# Patient Record
Sex: Female | Born: 1950 | Race: White | Hispanic: No | Marital: Married
Health system: Southern US, Community
[De-identification: ages and names within clinical notes are randomized; demographics above are authoritative.]

## PROBLEM LIST (undated history)

## (undated) DIAGNOSIS — I10 Essential (primary) hypertension: Secondary | ICD-10-CM

## (undated) HISTORY — DX: Essential (primary) hypertension: I10

## (undated) HISTORY — PX: RECTO-VAGINAL FISSURE REPAIR: SHX5277

---

## 2010-08-10 ENCOUNTER — Emergency Department (HOSPITAL_COMMUNITY): Admission: EM | Admit: 2010-08-10 | Discharge: 2010-08-10 | Payer: Self-pay | Admitting: Emergency Medicine

## 2010-08-13 ENCOUNTER — Emergency Department (HOSPITAL_COMMUNITY): Admission: EM | Admit: 2010-08-13 | Discharge: 2010-08-13 | Payer: Self-pay | Admitting: Emergency Medicine

## 2014-02-18 ENCOUNTER — Encounter (INDEPENDENT_AMBULATORY_CARE_PROVIDER_SITE_OTHER): Payer: Self-pay

## 2014-02-18 ENCOUNTER — Ambulatory Visit: Payer: Self-pay | Admitting: Family Medicine

## 2014-02-18 ENCOUNTER — Telehealth: Payer: Self-pay | Admitting: Family Medicine

## 2014-02-18 ENCOUNTER — Ambulatory Visit (INDEPENDENT_AMBULATORY_CARE_PROVIDER_SITE_OTHER): Payer: Self-pay

## 2014-02-18 VITALS — BP 140/80 | HR 69 | Temp 98.3°F

## 2014-02-18 DIAGNOSIS — M25572 Pain in left ankle and joints of left foot: Secondary | ICD-10-CM

## 2014-02-18 DIAGNOSIS — S82839A Other fracture of upper and lower end of unspecified fibula, initial encounter for closed fracture: Secondary | ICD-10-CM

## 2014-02-18 DIAGNOSIS — M25579 Pain in unspecified ankle and joints of unspecified foot: Secondary | ICD-10-CM

## 2014-02-18 DIAGNOSIS — S82899A Other fracture of unspecified lower leg, initial encounter for closed fracture: Secondary | ICD-10-CM

## 2014-02-18 MED ORDER — HYDROCODONE-ACETAMINOPHEN 5-325 MG PO TABS: 1.0000 | ORAL_TABLET | Freq: Four times a day (QID) | ORAL | Status: DC | PRN

## 2014-02-18 NOTE — Telephone Encounter (Signed)
appt given for 3:30 with Christine Gutierrez

## 2014-02-18 NOTE — Progress Notes (Signed)
   Subjective:    Patient ID: Christine Gutierrez, female    DOB: 12-Feb-1951, 63 y.o.   MRN: [id number]  HPI This 63 y.o. female presents for evaluation of pain and injury to her left ankle. She has severe pain in her left ankle.   Review of Systems C/o severe pain in her left ankle   No chest pain, SOB, HA, dizziness, vision change, N/V, diarrhea, constipation, dysuria, urinary urgency or frequency or rash.  Objective:   Physical Exam  Vital signs noted  Well developed well nourished female.  HEENT - Head atraumatic Normocephalic Respiratory - Lungs CTA bilateral Cardiac - RRR S1 and S2 without murmur MS - Left lateral ankle swollen and tender.     Xray of left ankle - Distal displaced fibula fx Prelimnary reading by Chrissie NoaWilliam Hamilton Marinello,FNP    Posterior splint put on left lower extremity Assessment & Plan:  Ankle pain, left - Plan: DG Ankle Complete Left, HYDROcodone-acetaminophen (NORCO) 5-325 MG per tablet, Ambulatory referral to Orthopedic Surgery  Fracture of distal fibula - Plan: HYDROcodone-acetaminophen (NORCO) 5-325 MG per tablet, Ambulatory referral to Orthopedic Surgery  Deatra CanterWilliam J Erlin Gardella FNP

## 2014-11-03 ENCOUNTER — Other Ambulatory Visit (HOSPITAL_COMMUNITY): Payer: Self-pay | Admitting: Internal Medicine

## 2014-11-03 DIAGNOSIS — Z1231 Encounter for screening mammogram for malignant neoplasm of breast: Secondary | ICD-10-CM

## 2014-11-13 ENCOUNTER — Ambulatory Visit (HOSPITAL_COMMUNITY): Payer: Self-pay

## 2014-12-11 ENCOUNTER — Ambulatory Visit (HOSPITAL_COMMUNITY): Payer: Self-pay

## 2017-01-01 ENCOUNTER — Other Ambulatory Visit: Payer: Self-pay | Admitting: Neurology

## 2017-01-01 DIAGNOSIS — R51 Headache: Principal | ICD-10-CM

## 2017-01-01 DIAGNOSIS — R519 Headache, unspecified: Secondary | ICD-10-CM

## 2017-01-05 ENCOUNTER — Other Ambulatory Visit: Payer: Self-pay | Admitting: Neurology

## 2017-01-05 DIAGNOSIS — R51 Headache: Principal | ICD-10-CM

## 2017-01-05 DIAGNOSIS — R519 Headache, unspecified: Secondary | ICD-10-CM

## 2017-01-22 ENCOUNTER — Other Ambulatory Visit: Payer: Self-pay | Admitting: Neurology

## 2017-01-22 DIAGNOSIS — M542 Cervicalgia: Secondary | ICD-10-CM

## 2017-01-26 ENCOUNTER — Inpatient Hospital Stay
Admission: RE | Admit: 2017-01-26 | Discharge: 2017-01-26 | Disposition: A | Discharge status: Home / Self Care | Payer: Self-pay | Source: Ambulatory Visit | Attending: Neurology | Admitting: Neurology

## 2017-01-27 ENCOUNTER — Ambulatory Visit
Admission: RE | Admit: 2017-01-27 | Discharge: 2017-01-27 | Disposition: A | Discharge status: Home / Self Care | Payer: Self-pay | Source: Ambulatory Visit | Attending: Neurology | Admitting: Neurology

## 2017-01-27 DIAGNOSIS — R519 Headache, unspecified: Secondary | ICD-10-CM

## 2017-01-27 DIAGNOSIS — R51 Headache: Principal | ICD-10-CM

## 2017-01-28 ENCOUNTER — Other Ambulatory Visit: Payer: Self-pay

## 2017-01-30 ENCOUNTER — Ambulatory Visit
Admission: RE | Admit: 2017-01-30 | Discharge: 2017-01-30 | Disposition: A | Discharge status: Home / Self Care | Payer: Worker's Compensation | Source: Ambulatory Visit | Attending: Neurology | Admitting: Neurology

## 2017-01-30 DIAGNOSIS — M542 Cervicalgia: Secondary | ICD-10-CM

## 2017-02-01 ENCOUNTER — Other Ambulatory Visit: Payer: Self-pay

## 2017-02-11 ENCOUNTER — Other Ambulatory Visit: Payer: Self-pay

## 2017-07-09 ENCOUNTER — Encounter (INDEPENDENT_AMBULATORY_CARE_PROVIDER_SITE_OTHER): Payer: Self-pay | Admitting: Orthopaedic Surgery

## 2017-07-09 ENCOUNTER — Ambulatory Visit (INDEPENDENT_AMBULATORY_CARE_PROVIDER_SITE_OTHER): Payer: Worker's Compensation | Admitting: Orthopaedic Surgery

## 2017-07-09 VITALS — BP 166/85 | HR 62 | Resp 14 | Ht 66.0 in | Wt 175.0 lb

## 2017-07-09 DIAGNOSIS — M25511 Pain in right shoulder: Secondary | ICD-10-CM | POA: Diagnosis not present

## 2017-07-09 DIAGNOSIS — G8929 Other chronic pain: Secondary | ICD-10-CM | POA: Diagnosis not present

## 2017-07-09 NOTE — Progress Notes (Signed)
Office Visit Note   Patient: Christine Gutierrez           Date of Birth: 24-May-1951           MRN: [id number] Visit Date: 07/09/2017              Requested by: No referring provider defined for this encounter. PCP: No primary care provider on file.   Assessment & Plan: Visit Diagnoses: Right shoulder pain with impingement and mild adhesive capsulitis  Plan: MRI scan right shoulder and follow-up thereafter  Follow-Up Instructions: No Follow-up on file.   Orders:  No orders of the defined types were placed in this encounter.  No orders of the defined types were placed in this encounter.     Procedures: No procedures performed   Clinical Data: No additional findings.   Subjective: Chief Complaint  Patient presents with  . Right Shoulder - Injury    Workman's Comp::: Christine Gutierrez is a 66 y o that presents with chronic R shoulder pain from a work injury from May 2017. She fell forward from a throw rug fall. She saw a neurologist then and not much else.   Christine Gutierrez is accompanied by rehabilitation nurse from [facility] and here for evaluation of an injury she sustained to her right shoulder. She lives and [place] [facility] and was working for a company in [employer] activities. She was on a brake when she tripped over a rug falling on her face. At the time she had a lot of bruising and by the next day was so uncomfortable she was seen at a local urgent care. She has been seen on a number of occasions at that particular urgent care for follow-up evaluation of problems with her face as well as for right shoulder pain that started within 24-48 hours after the injury. Her predominant problem was the issue with her face. She was eventually seen by Dr. Vela Prose, a neurologist in [place]. He has been working with her and is in the midst of an evaluation. She's tried "several medicines". Her secondary problems with her right shoulder.  She's having difficulty raising her arm over her head, reaching backwards and even sleeping on that side. Prior to the injury she did not have any problems. She doesn't really have any neck problems. She's not had any numbness or tingling. She denies shortness breath or chest pain. No skin changes or bruising. She was able to work for a number of months after the injury but was recently "fired". She hasn't worked since. Notes from her urgent care evaluation accompany her and scanned to the chart. Lorinda Creed, from [place], is her medical case manager and who  was present throughout the evaluation and discussion HPI  Review of Systems  Constitutional: Negative for chills, fatigue and fever.  Eyes: Negative for itching.  Respiratory: Negative for chest tightness and shortness of breath.   Cardiovascular: Negative for chest pain, palpitations and leg swelling.  Gastrointestinal: Negative for blood in stool, constipation and diarrhea.  Musculoskeletal: Negative for back pain, joint swelling, neck pain and neck stiffness.  Neurological: Positive for dizziness and headaches. Negative for numbness.  Hematological: Does not bruise/bleed easily.  Psychiatric/Behavioral: The patient is not nervous/anxious.      Objective: Vital Signs: There were no vitals taken for this visit.  Physical Exam  Ortho Exam awake alert and oriented 3 comfortable the sitting position. She did lack some overhead motion consistent with early adhesive capsulitis.  She is unable to place her right hand in the middle of her back. Positive impingement and positive empty can testing. Little bit of weakness with external rotation none with internal rotation. No pain over the acromioclavicular joint. No pain in the subacromial region. Skin intact. Biceps intact. Neurovascular exam intact to her right hand. Good grip and release. No referred pain to the right shoulder with range of motion of the cervical spine Walk without a limp. No  shortness of breath or chest pain  Specialty Comments:  No specialty comments available.  Imaging: No results found.   PMFS History: There are no active problems to display for this patient.  No past medical history on file.  No family history on file.  No past surgical history on file. Social History   Occupational History  . Not on file.   Social History Main Topics  . Smoking status: Not on file  . Smokeless tobacco: Not on file  . Alcohol use Not on file  . Drug use: Unknown  . Sexual activity: Not on file

## 2017-07-23 ENCOUNTER — Other Ambulatory Visit (INDEPENDENT_AMBULATORY_CARE_PROVIDER_SITE_OTHER): Payer: Self-pay

## 2017-07-23 ENCOUNTER — Telehealth (INDEPENDENT_AMBULATORY_CARE_PROVIDER_SITE_OTHER): Payer: Self-pay | Admitting: Orthopaedic Surgery

## 2017-07-23 DIAGNOSIS — M25511 Pain in right shoulder: Principal | ICD-10-CM

## 2017-07-23 DIAGNOSIS — G8929 Other chronic pain: Secondary | ICD-10-CM

## 2017-07-23 NOTE — Telephone Encounter (Signed)
I enter the MRI Right shoulder WO contrast(external with no site selected). Please see the message taken at front desk.

## 2017-07-23 NOTE — Telephone Encounter (Signed)
Worker's Comp company called requesting script for MRI be faxed over to them.  Fax #[phone] attnJanae Sauce Cb#[phone]

## 2017-07-24 NOTE — Telephone Encounter (Signed)
faxed

## 2017-07-31 ENCOUNTER — Other Ambulatory Visit: Payer: Self-pay

## 2017-10-18 ENCOUNTER — Other Ambulatory Visit: Payer: Self-pay | Admitting: Neurosurgery

## 2017-10-18 DIAGNOSIS — R519 Headache, unspecified: Secondary | ICD-10-CM

## 2017-10-18 DIAGNOSIS — R51 Headache: Principal | ICD-10-CM

## 2017-11-07 ENCOUNTER — Ambulatory Visit
Admission: RE | Admit: 2017-11-07 | Discharge: 2017-11-07 | Disposition: A | Discharge status: Home / Self Care | Payer: Worker's Compensation | Source: Ambulatory Visit | Attending: Neurosurgery | Admitting: Neurosurgery

## 2017-11-07 ENCOUNTER — Ambulatory Visit
Admission: RE | Admit: 2017-11-07 | Discharge: 2017-11-07 | Disposition: A | Discharge status: Home / Self Care | Payer: Worker's Compensation | Source: Ambulatory Visit | Attending: Orthopaedic Surgery | Admitting: Orthopaedic Surgery

## 2017-11-07 DIAGNOSIS — G8929 Other chronic pain: Secondary | ICD-10-CM

## 2017-11-07 DIAGNOSIS — R51 Headache: Principal | ICD-10-CM

## 2017-11-07 DIAGNOSIS — M25511 Pain in right shoulder: Principal | ICD-10-CM

## 2017-11-07 DIAGNOSIS — R519 Headache, unspecified: Secondary | ICD-10-CM

## 2017-11-16 ENCOUNTER — Ambulatory Visit (INDEPENDENT_AMBULATORY_CARE_PROVIDER_SITE_OTHER): Payer: Self-pay | Admitting: Orthopaedic Surgery

## 2017-11-23 ENCOUNTER — Telehealth: Payer: Self-pay

## 2017-11-23 ENCOUNTER — Ambulatory Visit (INDEPENDENT_AMBULATORY_CARE_PROVIDER_SITE_OTHER): Payer: Worker's Compensation | Admitting: Orthopaedic Surgery

## 2017-11-23 ENCOUNTER — Encounter (INDEPENDENT_AMBULATORY_CARE_PROVIDER_SITE_OTHER): Payer: Self-pay | Admitting: Orthopaedic Surgery

## 2017-11-23 VITALS — BP 154/86 | HR 80 | Resp 14 | Ht 64.0 in | Wt 185.0 lb

## 2017-11-23 DIAGNOSIS — M25511 Pain in right shoulder: Secondary | ICD-10-CM

## 2017-11-23 DIAGNOSIS — G8929 Other chronic pain: Secondary | ICD-10-CM

## 2017-11-23 NOTE — Progress Notes (Signed)
Office Visit Note   Patient: Christine Gutierrez           Date of Birth: [birth year]           MRN: [id number] Visit Date: 11/23/2017              Requested by: No referring provider defined for this encounter. PCP: Christine PontMahoney, Mark, DO   Assessment & Plan: Visit Diagnoses:  1. Chronic right shoulder pain     Plan: Christine Gutierrez was last seen in August for evaluation of right shoulder pain as outlined in her prior office note. This was a workman's comp injury 2017. I ordered an MRI scan. This scan was just performed December 5. MRI scan reveals supraspinatus worse than infraspinatus tendinopathy. No tear identified. No muscles were normal without atrophy. She had tendinopathy of the intra-articular segment of the biceps long head. There were moderate degenerative changes at the acromioclavicular joint with a type I acromion. Glenohumeral joint was negative. Labrum was intact. I think a course of physical therapy would be helpful. This is a workman's comp injury she will need to check with a carrier and the case Production designer, theatre/television/filmmanager. I'd like to see her back in 4-6 weeks after she's had a course of therapy consider further treatment options.  Follow-Up Instructions: Return in about 1 month (around 12/24/2017).   Orders:  No orders of the defined types were placed in this encounter.  No orders of the defined types were placed in this encounter.     Procedures: No procedures performed   Clinical Data: No additional findings.   Subjective: Chief Complaint  Patient presents with  . Right Shoulder - Pain    MRI results of right shoulder.  [place] for problems referable to her head and neck also related to a workman's comp injury. Apparently she's not had any specific treatment in regards to her shoulder since I saw her in August. Just had an MRI scan on December 5. I reviewed this with her as above  HPI  Review of Systems  Constitutional:  Positive for fatigue. Negative for chills and fever.  Eyes: Negative for itching.  Respiratory: Negative for chest tightness and shortness of breath.   Cardiovascular: Negative for chest pain, palpitations and leg swelling.  Gastrointestinal: Negative for blood in stool, constipation and diarrhea.  Endocrine: Negative for polyuria.  Genitourinary: Negative for dysuria.  Musculoskeletal: Positive for neck pain and neck stiffness. Negative for back pain and joint swelling.  Allergic/Immunologic: Negative for immunocompromised state.  Neurological: Positive for light-headedness and headaches. Negative for dizziness and numbness.  Hematological: Does not bruise/bleed easily.  Psychiatric/Behavioral: Positive for sleep disturbance. The patient is not nervous/anxious.      Objective: Vital Signs: BP (!) 154/86   Pulse 80   Resp 14   Ht 5\' 4"  (1.626 m)   Wt 185 lb (83.9 kg)   BMI 31.76 kg/m   Physical Exam  Ortho Exam right shoulder with a painful overhead arc of motion. She did lack about 30-40 of full overhead motion consistent with adhesive capsulitis. No pain with her arm at his side. No tenderness at the acromioclavicular joint. Biceps intact. Skin intact. Good strength. Some pain with abduction and 90 the subacromial region which also could be associated with adhesive capsulitis. Specialty Comments:  No specialty comments available.  Imaging: No results found.   PMFS History: There are no active problems to display for this patient.  History reviewed. No  pertinent past medical history.  History reviewed. No pertinent family history.  History reviewed. No pertinent surgical history. Social History   Occupational History  . Not on file  Tobacco Use  . Smoking status: Former Games developermoker  . Smokeless tobacco: Former Engineer, waterUser  Substance and Sexual Activity  . Alcohol use: No  . Drug use: No  . Sexual activity: Not on file

## 2017-11-23 NOTE — Telephone Encounter (Signed)
FYI- Patient called to give Dr. Cleophas DunkerWhitfield information:  Irene LimboAdjustor-Traci Bailey [phone] Fax#[phone]#-[id number] [email or web address]

## 2017-12-06 ENCOUNTER — Telehealth (INDEPENDENT_AMBULATORY_CARE_PROVIDER_SITE_OTHER): Payer: Self-pay | Admitting: Orthopaedic Surgery

## 2017-12-06 NOTE — Telephone Encounter (Signed)
Do you know anything regarding this patient

## 2017-12-06 NOTE — Telephone Encounter (Signed)
Patient left a message stating that at her last visit Dr. Cleophas DunkerWhitfield told her she was going to be referred to therapy.  Because it is WC her case worker Lourena SimmondsRebecca Berbahl will need to be called to get approval.  Her [phone]

## 2017-12-07 ENCOUNTER — Other Ambulatory Visit (INDEPENDENT_AMBULATORY_CARE_PROVIDER_SITE_OTHER): Payer: Self-pay

## 2017-12-07 DIAGNOSIS — M25511 Pain in right shoulder: Principal | ICD-10-CM

## 2017-12-07 DIAGNOSIS — G8929 Other chronic pain: Secondary | ICD-10-CM

## 2017-12-07 NOTE — Telephone Encounter (Signed)
Faxed and emailed office note and order to Irene Limbodjustor-Traci Bailey [phone] Fax#[phone] Claim#-[id number] [email or web address]

## 2017-12-07 NOTE — Telephone Encounter (Signed)
Entered order. Thank you Amy

## 2017-12-07 NOTE — Telephone Encounter (Signed)
Faxed and emailed office note and order to Adjustor-Traci Bailey [phone] Fax#-[phone] Claim#-[id number] [email or web address]   

## 2017-12-25 ENCOUNTER — Telehealth (INDEPENDENT_AMBULATORY_CARE_PROVIDER_SITE_OTHER): Payer: Self-pay | Admitting: Orthopaedic Surgery

## 2017-12-25 NOTE — Telephone Encounter (Signed)
Please see message below. I called pt, she was confused as to an injection. I saw in Dr. Hoy RegisterWhitfield's last note from 11/23/17 that he wanted her to go to PT.  Patient said she did go to PT for neck pain, and "they knew how bad my shoulder hurt" and tried to do exercises but the pain was too great. Apparently she was waiting on a Dr. Lorelle Gibbsurling (?) or Dr. Cleophas DunkerWhitfield as to next step. I read her your last entry and she said the case manager had possibly changed. This is the information she gave me today. Also, she lives closer to [place] office. Cletis Athensheryl Long - case manager [phone].  Pts phone, LM if no answer. [phone] Thank you

## 2017-12-25 NOTE — Telephone Encounter (Signed)
Patient called stating that she was scheduled to get an injection on Monday but is unsure why she needs it.  She was very confused on what she was scheduled for and why. CB# [phone]

## 2017-12-26 ENCOUNTER — Telehealth (INDEPENDENT_AMBULATORY_CARE_PROVIDER_SITE_OTHER): Payer: Self-pay | Admitting: Orthopaedic Surgery

## 2017-12-26 NOTE — Telephone Encounter (Signed)
Cletis Athensheryl Long, nurse case manager for patient left a voicemail requesting a return call.  Her # [phone]

## 2017-12-26 NOTE — Telephone Encounter (Signed)
Do you want to speak with her?

## 2017-12-27 NOTE — Telephone Encounter (Signed)
Emailed Sheryl Long to let me know what I can help her with and emailed her the 12/24/17 office note

## 2017-12-27 NOTE — Telephone Encounter (Signed)
Emailed Sheryl Long to contact me for this patient.

## 2017-12-28 NOTE — Telephone Encounter (Signed)
Sheryl Long left me voicemail requesting that an alert be put on pts account stating that she needs to set up all appts for pt so she can make sure she is present as she did not know anything about the 11/23/18 appt. Put an FYI alert on the chart.

## 2018-01-08 ENCOUNTER — Telehealth (INDEPENDENT_AMBULATORY_CARE_PROVIDER_SITE_OTHER): Payer: Self-pay | Admitting: Orthopaedic Surgery

## 2018-01-08 NOTE — Telephone Encounter (Signed)
pPatient called stating that she is still having a lot of pain in her right shoulder and has not heard from Mountain Viewheryl her case manager about scheduling an appointment.  Patient states that Dr. Lorelle Gibbsurling read her MRI and she states that "there is damage to a disc in her neck."  Patient stated that she was going to try to get in contact with her case manager so she can schedule an appointment.

## 2018-01-08 NOTE — Telephone Encounter (Signed)
This was sent to me.

## 2018-01-08 NOTE — Telephone Encounter (Signed)
Please advise 

## 2018-01-08 NOTE — Telephone Encounter (Signed)
Per other message received, case mgr did call after this and made appt

## 2018-01-08 NOTE — Telephone Encounter (Signed)
Sheryl Long called and scheduled an appointment for patient on 2/11 with Dr. Cleophas DunkerWhitfield.  Sheryl is having a difficult time getting Dr. Lorelle Gibbsurling to return her call or schedule another appointment for the patient and thinks it is because she was a no show for her neck injection.  Sheryl stated that the patient is having a lot of pain and hasn't been able to refill any prescriptions.

## 2018-01-14 ENCOUNTER — Encounter (INDEPENDENT_AMBULATORY_CARE_PROVIDER_SITE_OTHER): Payer: Self-pay | Admitting: Orthopaedic Surgery

## 2018-01-14 ENCOUNTER — Ambulatory Visit (INDEPENDENT_AMBULATORY_CARE_PROVIDER_SITE_OTHER): Payer: Worker's Compensation | Admitting: Orthopaedic Surgery

## 2018-01-14 VITALS — BP 187/80 | HR 57 | Resp 14 | Ht 64.0 in | Wt 185.0 lb

## 2018-01-14 DIAGNOSIS — G8929 Other chronic pain: Secondary | ICD-10-CM

## 2018-01-14 DIAGNOSIS — M25511 Pain in right shoulder: Secondary | ICD-10-CM | POA: Diagnosis not present

## 2018-01-14 NOTE — Progress Notes (Signed)
Office Visit Note   Patient: Christine Gutierrez Jeanette Longan           Date of Birth: 1951/04/15           MRN: [id number] Visit Date: 01/14/2018              Requested by: Lorelei PontMahoney, Mark, DO [address] [place], [place] [id number] PCP: Lorelei PontMahoney, Mark, DO   Assessment & Plan: Visit Diagnoses:  1. Chronic right shoulder pain     Plan: Long discussion with Mrs. Christine Moritaarter and HCA IncSheryl long, rehabilitation nurse, regarding present status of shoulder. At this point I would suggest closed manipulation of the right shoulder for adhesive capsulitis and follow up physical therapy. I think that we need to regain motion of the shoulder before we determine if any other treatment is necessary. She certainly will require physical therapy after the manipulation. Spent over 45 minutes discussing all of the above and in conference with Mrs. long  Follow-Up Instructions: Return will schedule manipulation of right shoulder.   Orders:  No orders of the defined types were placed in this encounter.  No orders of the defined types were placed in this encounter.     Procedures: No procedures performed   Clinical Data: No additional findings.   Subjective: Chief Complaint  Patient presents with  . Right Shoulder - Pain    Ms. Christine Gutierrez is a WC patient, she relates she has trouble with overhead raise but has weakness.  Last seen in December for evaluation of chronic on the job related shoulder pain. I suggested a course of physical therapy. To date therapy has not been either improve or schedule. Christine Gutierrez continues to have significant pain. Sheryl long from [facility] accompanies her today. He continues to have difficulty raising her arm over her head. Her exam is consistent with adhesive capsulitis. She also was having an issue with her cervical spine is being [place], Dr. Lorelle Gibbsurling. I think her present shoulder problem a separate from her cervical spine. No  change in work status  HPI  Review of Systems   Objective: Vital Signs: BP (!) 187/80   Pulse (!) 57   Resp 14   Ht 5\' 4"  (1.626 m)   Wt 185 lb (83.9 kg)   BMI 31.76 kg/m   Physical Exam  Ortho Exam right shoulder with significant limitation of motion. I can abduct about 70 and flex about 80. At that point there was pain. Below those ranges there was no pain. Difficult to determine if there was any impingement or empty can testing. Good grip and good release. Skin about the shoulder is intact. Several areas of localized tenderness but nothing that is localized. Biceps tendon appears to be intact.  Specialty Comments:  No specialty comments available.  Imaging: No results found.   PMFS History: There are no active problems to display for this patient.  Past Medical History:  Diagnosis Date  . Hypertension     History reviewed. No pertinent family history.  History reviewed. No pertinent surgical history. Social History   Occupational History  . Not on file  Tobacco Use  . Smoking status: Former Games developermoker  . Smokeless tobacco: Former Engineer, waterUser  Substance and Sexual Activity  . Alcohol use: No  . Drug use: No  . Sexual activity: Not on file     Valeria BatmanPeter W Ranjit Ashurst, MD   Note - This record has been created using [employer] software.  Chart creation errors have been  sought, but may not always  have been located. Such creation errors do not reflect on  the standard of medical care.

## 2018-01-15 ENCOUNTER — Telehealth (INDEPENDENT_AMBULATORY_CARE_PROVIDER_SITE_OTHER): Payer: Self-pay | Admitting: Orthopaedic Surgery

## 2018-01-15 NOTE — Telephone Encounter (Signed)
Whatever is convenient and WC will approve.

## 2018-01-15 NOTE — Telephone Encounter (Signed)
Left message for pt to call me back to discuss. 

## 2018-01-15 NOTE — Telephone Encounter (Signed)
Patient calling requesting a call back to discuss previous Physical Therapy order. Per patient she never heard anything from that, and would like to know why that was not approved. Patient has decided to hold off on surgery right now, and do therapy instead. Per patient she does not feel like she is in any shape to be able to handle surgery right now due to BP, among other things. Please call to discuss.

## 2018-01-15 NOTE — Telephone Encounter (Signed)
Opened in error

## 2018-01-15 NOTE — Telephone Encounter (Signed)
Christine Gutierrez nurse case manager for Christine MarchJeanette called stating that she is helping Christine EnergySheryl Gutierrez.  Patient was scheduled to see Christine Gutierrez today 2/11 and cx due to having a headache.  Christine Gutierrez told Christine Gutierrez that he would be willing to see the patient on 3/5 but if she cx or no shows then he will discharge pt due to non-compliance.  Christine Gutierrez requested that Christine Gutierrez return her call at #[phone].

## 2018-01-15 NOTE — Telephone Encounter (Signed)
Add this to the message

## 2018-01-15 NOTE — Telephone Encounter (Signed)
Christine Gutierrez called and spoke with case manager.

## 2018-01-18 ENCOUNTER — Telehealth (INDEPENDENT_AMBULATORY_CARE_PROVIDER_SITE_OTHER): Payer: Self-pay

## 2018-01-18 NOTE — Telephone Encounter (Signed)
OK for PT

## 2018-01-18 NOTE — Telephone Encounter (Signed)
Pt wants to go to [facility] in [place] and this is approved per adj Lurena Joinerebecca. Faxed her the rx and [facility].

## 2018-01-18 NOTE — Telephone Encounter (Signed)
Patient has declined to have surgery and wants to just have PT at this time. Case mgr says rx they have for PT is for PT s/p surgery so she will need a new rx IF Dr. Cleophas DunkerWhitfield is in agreement. Please advise  If this is ok and I will get her new rx. Thanks

## 2018-01-20 NOTE — Telephone Encounter (Signed)
OK 

## 2018-01-21 NOTE — Telephone Encounter (Signed)
Faxed rx to [phone]

## 2018-01-21 NOTE — Telephone Encounter (Signed)
OK per Dr. Whitfield 

## 2018-01-28 ENCOUNTER — Telehealth (INDEPENDENT_AMBULATORY_CARE_PROVIDER_SITE_OTHER): Payer: Self-pay

## 2018-01-28 NOTE — Telephone Encounter (Signed)
Sent message to Amy Bishop [facility]. I LVMOM to make sure she still wants OT to call her WC adjuster

## 2018-02-05 ENCOUNTER — Other Ambulatory Visit (INDEPENDENT_AMBULATORY_CARE_PROVIDER_SITE_OTHER): Payer: Self-pay | Admitting: Radiology

## 2018-02-05 ENCOUNTER — Telehealth (INDEPENDENT_AMBULATORY_CARE_PROVIDER_SITE_OTHER): Payer: Self-pay | Admitting: Orthopaedic Surgery

## 2018-02-05 DIAGNOSIS — M25511 Pain in right shoulder: Secondary | ICD-10-CM

## 2018-02-05 NOTE — Telephone Encounter (Signed)
Ok to send prescription for PT

## 2018-02-05 NOTE — Telephone Encounter (Signed)
11/07/2017 Shoulder MRI report faxed to [facility] [phone]

## 2018-02-05 NOTE — Telephone Encounter (Signed)
[facility] and she stated they received [facility] and the pt has been there for two appointments already.

## 2018-02-05 NOTE — Telephone Encounter (Signed)
Katie from [facility] left a voicemail stating they need a prescription for P.T. to be faxed to #[phone]

## 2018-02-05 NOTE — Telephone Encounter (Signed)
Dr. Cleophas DunkerWhitfield pt. Need rx for thearpy. Please advise.

## 2018-02-14 ENCOUNTER — Telehealth (INDEPENDENT_AMBULATORY_CARE_PROVIDER_SITE_OTHER): Payer: Self-pay

## 2018-02-14 ENCOUNTER — Telehealth (INDEPENDENT_AMBULATORY_CARE_PROVIDER_SITE_OTHER): Payer: Self-pay | Admitting: Orthopaedic Surgery

## 2018-02-14 NOTE — Telephone Encounter (Signed)
Patient called stating that she saw Dr. Lorelle Gibbsurling last week who told her the "the pain is coming from nerve pain in her disc."  Patient states she is having shooting pain in her right shoulder and going down to her elbow.   Dr. Lorelle Gibbsurling prescribed the following medications that patient is taking:   Orphenadrine 100 mg taking for spasms 1 tablet every 8 hours or as needed, Keppra 500 mg 1 tablet twice per day, Viclofenac sod 75 mg 1 pill twice/day.  Patient is requesting that Dr. Cleophas DunkerWhitfield prescribe something to help with her shoulder pain.

## 2018-02-14 NOTE — Telephone Encounter (Signed)
Pt states she is having increased pain since starting PT. Having shooting pain down arm to elbow. Taking diclofenac. Wants to know if there is something else she can be given. Uses [facility].

## 2018-02-15 NOTE — Telephone Encounter (Signed)
please advise.

## 2018-02-15 NOTE — Telephone Encounter (Signed)
Please advise 

## 2018-02-18 NOTE — Telephone Encounter (Signed)
Called and advised pt to fu with pcp for more meds.

## 2018-02-18 NOTE — Telephone Encounter (Signed)
See other note

## 2018-02-18 NOTE — Telephone Encounter (Signed)
Notified pt to follow up with pcp to discuss more or different meds since she is taking so many meds now. She is going to call her pcp to do that.. She states shes still doing her thearpy and will make appt w dr. Cleophas DunkerWhitfield afterwards.

## 2018-02-18 NOTE — Telephone Encounter (Signed)
I am concerned about the number of medicines she is taking and the potenial interactions-would be better for her primary physician to prescribe any other meds or Dr Curling-please call

## 2018-02-28 ENCOUNTER — Emergency Department (HOSPITAL_COMMUNITY)
Admission: EM | Admit: 2018-02-28 | Discharge: 2018-02-28 | Disposition: A | Discharge status: Home / Self Care | Payer: Worker's Compensation | Attending: Emergency Medicine | Admitting: Emergency Medicine

## 2018-02-28 ENCOUNTER — Telehealth (INDEPENDENT_AMBULATORY_CARE_PROVIDER_SITE_OTHER): Payer: Self-pay | Admitting: Orthopaedic Surgery

## 2018-02-28 ENCOUNTER — Encounter (HOSPITAL_COMMUNITY): Payer: Self-pay | Admitting: Emergency Medicine

## 2018-02-28 DIAGNOSIS — M25511 Pain in right shoulder: Secondary | ICD-10-CM | POA: Insufficient documentation

## 2018-02-28 DIAGNOSIS — I10 Essential (primary) hypertension: Secondary | ICD-10-CM | POA: Insufficient documentation

## 2018-02-28 DIAGNOSIS — Z79899 Other long term (current) drug therapy: Secondary | ICD-10-CM | POA: Insufficient documentation

## 2018-02-28 DIAGNOSIS — G8929 Other chronic pain: Secondary | ICD-10-CM

## 2018-02-28 MED ORDER — HYDROCODONE-ACETAMINOPHEN 5-325 MG PO TABS: 1.0000 | ORAL_TABLET | Freq: Three times a day (TID) | ORAL | 0 refills | Status: DC | PRN

## 2018-02-28 MED ORDER — TRAMADOL HCL 50 MG PO TABS
50.0000 mg | ORAL_TABLET | Freq: Once | ORAL | Status: AC
  Administered 2018-02-28: 50 mg via ORAL
  Filled 2018-02-28: qty 1

## 2018-02-28 NOTE — Telephone Encounter (Signed)
PLEASE ADVISE.

## 2018-02-28 NOTE — Discharge Instructions (Addendum)
Continue home pain medications.  Follow up with your orthopedist on Monday morning.  Return for any fever, joint swelling or redness.   For breakthrough pain you may take Norco. Do not drink alcohol drive or operate heavy machinery when taking. You are being provided a prescription for opiates (also known as narcotics) for pain control on an ?as needed? basis.  Opiates can be addictive and should only be used when absolutely necessary for pain control when other alternatives do not work.  We recommend you only use them for the recommended amount of time and only as prescribed.  Please do not take with other sedative medications or alcohol.  Please do not drive, operate machinery, or make important decisions while taking opiates.  Please note that these medications can be addictive and have high abuse potential.  Please keep these medications locked away from children, teenagers or any family members with history of substance abuse. Additionally, these medications may cause constipation - take over the counter stool softeners or add fiber to your diet to treat this (Metamucil, Psyllium Fiber, Colace, Miralax) Further refills will need to be obtained from your primary care doctor and will not be prescribed through the Emergency Department. You will test positive on most drug tests while taking this medication.

## 2018-02-28 NOTE — ED Notes (Signed)
Pt ambulatory to room with steady gait

## 2018-02-28 NOTE — ED Provider Notes (Signed)
[facility] EMERGENCY DEPARTMENT Provider Note   CSN: [id number] Arrival date & time: 02/28/18  1134     History   Chief Complaint Chief Complaint  Patient presents with  . Shoulder Injury    HPI Gilberto Streck is a 67 y.o. female with a history of hypertension who presents the emergency department today for right shoulder pain.  Patient states that 2 years ago she was an injury where she fell onto her right shoulder.  Since that time she has been dealing with cervical spine and right shoulder pain.  She is currently followed by a neurosurgeon, Dr. Lorelle Gibbs as well as a orthopedist, Dr. Cleophas Dunker. The patient is currently taking Orphenadrine 100 mg taking for spasms 1 tablet every 8 hours or as needed, Keppra 500 mg 1 tablet twice per day, Diclofenac sod 75 mg, 1 pill twice/day for pain.  She states that since doing physical therapy in the last several days her pain has increased.  She notes that it is a nagging pain in the posterior right shoulder that is worse with movement and palpation.  She denies any new trauma.  Patient did have a MRI of her cervical spine and right shoulder.  This showed spurring causing bilateral foraminal narrowing at C4-5 (moderate-severe), C5-6 (severe) and C6-7 (mild-moderate) as well as rotator cuff tendinopathy without tear, worst in the supraspinatus as well as mild tendinopathy of the intra-articular long head of the biceps; osteoarthritis and bursitis.  The patient called her neurosurgeon and orthopedist office but was not able to get an appointment until Monday.  She is presenting for pain control until that time.  Patient denies any fever, new injury, visual changes, occult he with speech, facial droop, vertigo, numbness/tingling/weakness of the extremities, bowel/bladder incontinence.  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  HPI  Past Medical History:  Diagnosis Date  . Hypertension     There are no active problems to  display for this patient.   History reviewed. No pertinent surgical history.   OB History   None      Home Medications    Prior to Admission medications   Medication Sig Start Date End Date Taking? Authorizing Provider  diclofenac (VOLTAREN) 75 MG EC tablet Take 75 mg by mouth 2 (two) times daily. 10/23/17   [provider]  DULoxetine (CYMBALTA) 20 MG capsule Take by mouth daily. 12/12/17   [provider]  levothyroxine (SYNTHROID, LEVOTHROID) 25 MCG tablet levothyroxine 25 mcg tablet  Take 1 tablet every day by oral route for 30 days.    [provider]  losartan (COZAAR) 50 MG tablet losartan 50 mg tablet    [provider]  pravastatin (PRAVACHOL) 20 MG tablet pravastatin 20 mg tablet  Take 1 tablet every day by oral route for 30 days.    [provider]  valsartan (DIOVAN) 80 MG tablet  06/07/17   [provider]    Family History No family history on file.  Social History Social History   Tobacco Use  . Smoking status: Former Games developer  . Smokeless tobacco: Former Engineer, water Use Topics  . Alcohol use: No  . Drug use: No     Allergies   Patient has no known allergies.   Review of Systems Review of Systems  All other systems reviewed and are negative.    Physical Exam Updated Vital Signs BP (!) 174/68 (BP Location: Left Arm)   Pulse (!) 59   Temp 98.3  F (36.8 C) (Oral)   Resp 14   Ht 5\' 6"  (1.676 m)   Wt 90.7 kg (200 lb)   SpO2 98%   BMI 32.28 kg/m   Physical Exam  Constitutional: She appears well-developed and well-nourished.  HENT:  Head: Normocephalic and atraumatic.  Right Ear: Tympanic membrane and external ear normal.  Left Ear: Tympanic membrane and external ear normal.  Nose: Nose normal.  Eyes: Conjunctivae are normal. Right eye exhibits no discharge. Left eye exhibits no discharge. No scleral icterus.  Cardiovascular:  Pulses:      Radial pulses are 2+ on the right side, and 2+  on the left side.  Pulmonary/Chest: Effort normal. No respiratory distress.  Musculoskeletal:  Cervical Spine: Appearance normal. No obvious bony deformity. No skin swelling, erythema, heat, fluctuance or break of the skin. TTP over the cervical spinous processes at level C4, C5, C6.  Right paraspinal tenderness. No step-offs noted. Patient is able to actively rotate their neck 45 degrees left and right voluntarily without pain and flex and extend the neck without pain.  Equivocal Spurling's.  Right Shoulder: Appearance normal. No obvious bony deformity. No skin swelling, erythema, heat, fluctuance or break of the skin. No clavicular deformity or TTP. TTP over bicipital groove, AC joint and posterior scapular spine.  Patient with limited active and passive abduction to 70 degrees secondary to stiffness and pain.  Patient with limited active and passive flexion to 90 degrees secondary to stiffness and pain. Mildly decreased external rotation. Patient able to demonstrate intact adduction, internal rotation.  Strength for flexion, extension, abduction, adduction, and internal/external rotation intact and appropriate for age.  Right Elbow: Appearance normal. No obvious bony deformity. No skin swelling, erythema, heat, fluctuance or break of the skin. No TTP over joint. Active flexion, extension, supination and pronation full and intact without pain. Strength able and appropriate for age for flexion and extension.  Radial Pulse 2+. Cap refill <2 seconds. SILT for M/U/R distributions. Compartments soft.   Neurological: She is alert. She has normal strength. No sensory deficit.  Speech clear. Follows commands. No facial droop. PERRLA. EOMI. Normal peripheral fields. CN III-XII intact.  Grossly moves all extremities 4 without ataxia. There is limited right shoulder motion 2/2 pain. Coordination intact. Able and appropriate strength for age to upper and lower extremities bilaterally including grip strength &  plantar flexion/dorsiflexion. Sensation to light touch intact bilaterally for upper and lower. Patellar deep tendon reflex 2+ and equal bilaterally. Normal finger to nose. No pronator drift. Normal gait.   Skin: Skin is warm and dry. Capillary refill takes less than 2 seconds. No rash noted. No pallor.  No vesicular-like rash on the right shoulder.  Psychiatric: She has a normal mood and affect.  Nursing note and vitals reviewed.    ED Treatments / Results  Labs (all labs ordered are listed, but only abnormal results are displayed) Labs Reviewed - No data to display  EKG None  Radiology No results found.  Procedures Procedures (including critical care time)  Medications Ordered in ED Medications  traMADol (ULTRAM) tablet 50 mg (50 mg Oral Given 02/28/18 1321)     Initial Impression / Assessment and Plan / ED Course  I have reviewed the triage vital signs and the nursing notes.  Pertinent labs & imaging results that were available during my care of the patient were reviewed by me and considered in my medical decision making (see chart for details).      67 y.o. female presenting  for pain control.  Patient states that 2 years ago she was an injury where she fell onto her right shoulder.  Since that time she has been dealing with cervical spine and right shoulder pain.  She is currently followed by a neurosurgeon, Dr. Lorelle Gibbsurling as well as a orthopedist, Dr. Cleophas DunkerWhitfield. The patient is currently taking Orphenadrine 100 mg taking for spasms 1 tablet every 8 hours or as needed, Keppra 500 mg 1 tablet twice per day, Diclofenac sod 75 mg, 1 pill twice/day for pain.  She states that since doing physical therapy in the last several days her pain has increased.  She notes that it is a nagging pain in the posterior right shoulder that is worse with movement and palpation.  She denies any new trauma.  Patient did have a MRI of her cervical spine and right shoulder.  This showed spurring causing  bilateral foraminal narrowing at C4-5 (moderate-severe), C5-6 (severe) and C6-7 (mild-moderate) as well as rotator cuff tendinopathy without tear, worst in the supraspinatus as well as mild tendinopathy of the intra-articular long head of the biceps; osteoarthritis and bursitis.  The patient called her neurosurgeon and orthopedist office but was not able to get an appointment until Monday. Patient denies any fever, new injury, visual changes, occult he with speech, facial droop, vertigo, numbness/tingling/weakness of the extremities, bowel/bladder incontinence.  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  Patients vitals are reassuring. On exam the patient does have limited rom c/w previous dx of frozen shoulder.  No fever, joint swelling or overlying erythema.  Do not suspect septic joint.  No new trauma.  Patient has previous imaging as stated above.  No neurologic deficits.  Suspect this is patient's chronic pain that has been exacerbated during PT exercises.  Told patient she should continue PT.  Will provide short course of pain medication until patient is able to follow-up with orthopedist on Monday.  The evaluation does not show pathology that would require ongoing emergent intervention or inpatient treatment. I advised the patient to follow-up with PCP/orthopedist/neurosurgeon this week. I advised the patient to return to the emergency department with new or worsening symptoms or new concerns. Specific return precautions discussed. The patient verbalized understanding and agreement with plan. All questions answered. No further questions at this time. The patient is hemodynamically stable, mentating appropriately and appears safe for discharge.  Final Clinical Impressions(s) / ED Diagnoses   Final diagnoses:  Chronic right shoulder pain    ED Discharge Orders    None       Jacinto HalimMaczis, Harrie Cazarez M, Cordelia Poche-C 02/28/18 1343    Tegeler, Canary Brimhristopher J, MD 02/28/18 [phone]

## 2018-02-28 NOTE — ED Triage Notes (Signed)
Pt reports a shoulder injury 2 years ago that has always caused pain but now pain started in right side of neck into shoulder and elbow. Pt is currently in therapy for this. Pt tried to get appointment with orthopedic MD but unable to so came to ED for pain control.

## 2018-02-28 NOTE — Telephone Encounter (Signed)
Tried to contact her but she was not able to come to the phone.  Only she was not home.

## 2018-02-28 NOTE — Telephone Encounter (Signed)
Patient called stating that she is in a lot of pain and physical therapy does not seem to be working.  Patient is confused about what to do about her right arm pain and states that it is a burning pain that is going down to her elbow.  Patient states that Dr. Lorelle Gibbsurling told her that "it seems to be coming from the nerve."  Patient states she was unable to reach Dr. Lorelle Gibbsurling and called our office to see if Dr. Cleophas DunkerWhitfield would recommend a cortisone shot to help with her pain.   Patient states Dr. Cleophas DunkerWhitfield told her "she has a frozen shoulder."    Patient states her case manager's name is Donna ChristenMichelle Gunselman and her phone # [phone].  Patient states she is currently taking the following medications:  Orphenadrine 100 mg, 1 tab every 8 hours for pain/spasm.  Diclofenac Sod 75 mg, 1 tab twice per day.  Keptra 500 mg 1 tab twice per day.

## 2018-03-01 ENCOUNTER — Telehealth (INDEPENDENT_AMBULATORY_CARE_PROVIDER_SITE_OTHER): Payer: Self-pay | Admitting: Orthopaedic Surgery

## 2018-03-01 NOTE — Telephone Encounter (Addendum)
Patient calling in reference to increased shoulder pain that drove her to the ER yesterday. Patient was given rx for Hydrocodone 5mg . Patient at PT now, and was advised to call here to inform doctor. PT is requiring a note if patient is to continue PT at this point. Fax# [phone] Dianna Rizza. Call patient at home # if she does not answer mobile #

## 2018-03-01 NOTE — Telephone Encounter (Signed)
Please advise 

## 2018-03-04 NOTE — Telephone Encounter (Signed)
I spoke with patient who stated that [facility] be [facility] to schedule an appointment with Dr. Cleophas DunkerWhitfield.

## 2018-03-04 NOTE — Telephone Encounter (Signed)
PLEASE CALL HER AND SCHEDULE AN APPT TO BE SEEN BY DR Cleophas DunkerWHITFIELD

## 2018-03-06 ENCOUNTER — Encounter (INDEPENDENT_AMBULATORY_CARE_PROVIDER_SITE_OTHER): Payer: Self-pay | Admitting: Orthopaedic Surgery

## 2018-03-06 ENCOUNTER — Ambulatory Visit (INDEPENDENT_AMBULATORY_CARE_PROVIDER_SITE_OTHER): Payer: Worker's Compensation | Admitting: Orthopaedic Surgery

## 2018-03-06 DIAGNOSIS — M25511 Pain in right shoulder: Secondary | ICD-10-CM

## 2018-03-06 DIAGNOSIS — G8929 Other chronic pain: Secondary | ICD-10-CM | POA: Diagnosis not present

## 2018-03-06 NOTE — Progress Notes (Signed)
Office Visit Note   Patient: Christine Gutierrez           Date of Birth: 08-17-1951           MRN: [id number] Visit Date: 03/06/2018              Requested by: Christine Gutierrez, Mark, Christine Gutierrez [address] [place], [place] [id number] PCP: Christine Gutierrez, Mark, Christine Gutierrez   Assessment & Plan: Visit Diagnoses:  1. Chronic right shoulder pain     Plan: Workmen's Comp. injury from May 2017 resulting in adhesive capsulitis right shoulder.  Have seen on several occasions in the past and of recommended shoulder manipulation.  Presently going to physical therapy in the evening.  Questionable improvement.  Also seeing Christine Gutierrez for evaluation of cervical spine and has planned injections in the near future.  Would suggest continuing physical therapy completing her treatment with Dr. curling.  If still having a problem with the shoulder would suggest manipulation  Follow-Up Instructions: Return in about 1 month (around 04/05/2018).   Orders:  No orders of the defined types were placed in this encounter.  No orders of the defined types were placed in this encounter.     Procedures: No procedures performed   Clinical Data: No additional findings.   Subjective: Chief Complaint  Patient presents with  . Right Shoulder - Pain    r shoulder pain from falling forward 04-04-18 will be two years  . Follow-up    right shoulder pain from fall 2 years ago  This is Christine Gutierrez is not sure that she has had much improvement after 5 sessions of physical therapy.  She is been approved for 12.  Right have improved range of motion.  Some pain.  Recently went to the emergency room because of shoulder pain and was given a prescription for hydrocodone.  Needs a note for me to allow her to continue with physical therapy.  Nurse did not accompany her today.  HPI  Review of Systems  Constitutional: Negative for fever.  HENT: Negative for ear pain.   Eyes: Negative for pain.  Respiratory: Negative for cough.     Cardiovascular: Negative for leg swelling.  Gastrointestinal: Negative for constipation and diarrhea.  Genitourinary: Negative for difficulty urinating.  Musculoskeletal: Positive for back pain and neck pain.  Skin: Negative for rash.  Allergic/Immunologic: Negative for food allergies.  Neurological: Positive for weakness and numbness.  Hematological: Does not bruise/bleed easily.  Psychiatric/Behavioral: Positive for sleep disturbance.     Objective: Vital Signs: There were no vitals taken for this visit.  Physical Exam  Ortho Exam awake alert and oriented x3 comfortable sitting.  Does have pain with range of motion of her right shoulder above 120 degrees of flexion.  Abducts about 90 degrees.  Pain with internal/external rotation probably related to the adhesive capsulitis.  Prior MRI scan demonstrates tendinitis.  Some mild loss of motion of the left shoulder which is not Workmen's Comp. related  Specialty Comments:  No specialty comments available.  Imaging: No results found.   PMFS History: There are no active problems to display for this patient.  Past Medical History:  Diagnosis Date  . Hypertension     History reviewed. No pertinent family history.  Past Surgical History:  Procedure Laterality Date  . RECTO-VAGINAL FISSURE REPAIR     Social History   Occupational History  . Not on file  Tobacco Use  . Smoking status: Former Games developermoker  . Smokeless tobacco: Former Engineer, waterUser  Substance  and Sexual Activity  . Alcohol use: No  . Drug use: No  . Sexual activity: Not on file

## 2018-03-08 ENCOUNTER — Telehealth (INDEPENDENT_AMBULATORY_CARE_PROVIDER_SITE_OTHER): Payer: Self-pay

## 2018-03-08 NOTE — Telephone Encounter (Signed)
Work comp needs to know if pt is to continue physical therapy and if so they will need an updated rx and need current work status note faxed to them @ [phone]

## 2018-03-11 ENCOUNTER — Encounter (INDEPENDENT_AMBULATORY_CARE_PROVIDER_SITE_OTHER): Payer: Self-pay | Admitting: Radiology

## 2018-03-11 NOTE — Telephone Encounter (Signed)
PLEASE ADVISE. THANK YOU 

## 2018-03-11 NOTE — Telephone Encounter (Signed)
Needs new prescription to continue PT for right shoulder adhesive capsulitis and note to maintain present work restrictions

## 2018-03-12 NOTE — Telephone Encounter (Signed)
Per our discussion.

## 2018-03-12 NOTE — Telephone Encounter (Signed)
Looked through chart did not see a prior work restrictions note or pt. Where would you like to send her for PT ands what work restrictions would you like in the note? Thank you.

## 2018-03-14 ENCOUNTER — Telehealth (INDEPENDENT_AMBULATORY_CARE_PROVIDER_SITE_OTHER): Payer: Self-pay | Admitting: Orthopaedic Surgery

## 2018-03-14 NOTE — Telephone Encounter (Signed)
Received VM from patient wanting records. IC, unable to leave msg (mailbox full)I called back to advise need to sign records authorization form.

## 2018-06-12 ENCOUNTER — Telehealth (INDEPENDENT_AMBULATORY_CARE_PROVIDER_SITE_OTHER): Payer: Self-pay | Admitting: Orthopaedic Surgery

## 2018-06-12 NOTE — Telephone Encounter (Signed)
Patient called and scheduled an appointment on 06/18/18.  Patient states her new adjustor for her worker's compensation claim is Lynnea MaizesMichelle Guselman [phone] who is aware of this appointment.

## 2018-06-13 NOTE — Telephone Encounter (Signed)
Just an FYI

## 2018-06-18 ENCOUNTER — Ambulatory Visit (INDEPENDENT_AMBULATORY_CARE_PROVIDER_SITE_OTHER): Payer: Self-pay | Admitting: Orthopaedic Surgery

## 2018-06-25 ENCOUNTER — Telehealth (INDEPENDENT_AMBULATORY_CARE_PROVIDER_SITE_OTHER): Payer: Self-pay | Admitting: Orthopaedic Surgery

## 2018-06-25 NOTE — Telephone Encounter (Signed)
JUST AN FYI 

## 2018-06-25 NOTE — Telephone Encounter (Signed)
thanks

## 2018-06-25 NOTE — Telephone Encounter (Signed)
FYI: Per patient, she had a cortizone injection in rt shoulder with Dr. Lorelle Gibbsurling. Pain has decreased, but Dr. Lorelle Gibbsurling wanted patient to see Dr. Cleophas DunkerWhitfield again before he saw her back. Patient has a scheduled appt Friday.

## 2018-06-28 ENCOUNTER — Ambulatory Visit (INDEPENDENT_AMBULATORY_CARE_PROVIDER_SITE_OTHER): Payer: Worker's Compensation | Admitting: Orthopaedic Surgery

## 2018-06-28 ENCOUNTER — Encounter (INDEPENDENT_AMBULATORY_CARE_PROVIDER_SITE_OTHER): Payer: Self-pay | Admitting: Orthopaedic Surgery

## 2018-06-28 VITALS — BP 137/73 | HR 66 | Ht 64.0 in | Wt 185.0 lb

## 2018-06-28 DIAGNOSIS — M25511 Pain in right shoulder: Secondary | ICD-10-CM | POA: Diagnosis not present

## 2018-06-28 DIAGNOSIS — G8929 Other chronic pain: Secondary | ICD-10-CM

## 2018-06-28 NOTE — Progress Notes (Signed)
Office Visit Note   Patient: Christine Gutierrez           Date of Birth: 28-Jan-1951           MRN: [id number] Visit Date: 06/28/2018              Requested by: Lorelei Pont, DO [address] [place], [place] [address] PCP: Lorelei Pont, DO   Assessment & Plan: Visit Diagnoses:  1. Chronic right shoulder pain     Plan: Chronic problem with right shoulder as outlined in prior office evaluations.  Also seeing Dr. Lorelle Gibbs at [facility] regarding problem with her cervical spine.  He performed a cortisone injection of her right shoulder and she actually is feeling a little better.  I had a long discussion with her summarizing her prior office visit MRI scan and diagnoses.  She is better but still has some loss of overhead motion and internal rotation.  I think it is worth trying another course of physical therapy at this point and then returning in 6 to 8 weeks.  At some point she may be a candidate for manipulation and/or arthroscopic debridement.  Hopefully she will improve to the point where she will not need either manipulation or arthroscopic debridement.   because of problems with anesthesia we might want to continue with conservative treatment  Follow-Up Instructions: Return in about 3 months (around 09/28/2018).   Orders:  No orders of the defined types were placed in this encounter.  No orders of the defined types were placed in this encounter.     Procedures: No procedures performed   Clinical Data: No additional findings.   Subjective: Chief Complaint  Patient presents with  . New Patient (Initial Visit)    R SHOULDER PAIN HAD INJURY 04/04/18 FELL FOWARD ON ARMS. RADIATES DOWN ARM INTO ELBOW.. 06/13/18 HAD CORTISONE INJECTION  Long history of right shoulder pain as result of an on-the-job injury as previously outlined.  In my prior office visits I suggested manipulation based on her adhesive capsulitis.  MRI scan of her shoulder in December 2018 revealing rotator cuff  tendinopathy without a tear.  He has moderate acromial clavicular joint osteoarthritis and subacromial bursitis. S He also is being followed by Dr. Lorelle Gibbs at [facility] for the problem with her cervical spine.  She has had several cervical spine injections.  Presently she notes that her shoulder is better after the injection. After further questioning [name] relates that she has had some problem with anesthesia in the past with an "unusual reaction" HPI  Review of Systems  Constitutional: Positive for fatigue. Negative for fever.  HENT: Negative for ear pain.   Eyes: Negative for pain.  Respiratory: Negative for cough and shortness of breath.   Cardiovascular: Negative for leg swelling.  Gastrointestinal: Negative for constipation and diarrhea.  Genitourinary: Negative for difficulty urinating.  Musculoskeletal: Positive for back pain and neck pain.  Skin: Negative for rash.  Allergic/Immunologic: Negative for food allergies.  Neurological: Positive for weakness and numbness.  Hematological: Does not bruise/bleed easily.  Psychiatric/Behavioral: Positive for sleep disturbance.     Objective: Vital Signs: BP 137/73 (BP Location: Left Arm, Patient Position: Sitting, Cuff Size: Normal)   Pulse 66   Ht 5\' 4"  (1.626 m)   Wt 185 lb (83.9 kg)   BMI 31.76 kg/m   Physical Exam  Constitutional: She is oriented to person, place, and time. She appears well-developed and well-nourished.  HENT:  Mouth/Throat: Oropharynx is clear and moist.  Eyes:  Pupils are equal, round, and reactive to light. EOM are normal.  Pulmonary/Chest: Effort normal.  Neurological: She is alert and oriented to person, place, and time.  Skin: Skin is warm and dry.  Psychiatric: She has a normal mood and affect. Her behavior is normal.    Ortho Exam awake alert and oriented x3.  Comfortable sitting.  Seems to have better range of motion of her right shoulder from prior exams.  Still lacks internal rotation.  She  can touch the bar spine but not the thoracolumbar junction.  He still lacks about 30 degrees of full overhead motion.  Discomfort along the anterior subacromial region.  Biceps intact.  Skin intact.  Specialty Comments:  No specialty comments available.  Imaging: No results found.   PMFS History: There are no active problems to display for this patient.  Past Medical History:  Diagnosis Date  . Hypertension     History reviewed. No pertinent family history.  Past Surgical History:  Procedure Laterality Date  . RECTO-VAGINAL FISSURE REPAIR     Social History   Occupational History  . Not on file  Tobacco Use  . Smoking status: Passive Smoke Exposure - Never Smoker  . Smokeless tobacco: Never Used  Substance and Sexual Activity  . Alcohol use: No  . Drug use: No  . Sexual activity: Not on file

## 2018-07-22 ENCOUNTER — Telehealth (INDEPENDENT_AMBULATORY_CARE_PROVIDER_SITE_OTHER): Payer: Self-pay | Admitting: Orthopaedic Surgery

## 2018-07-22 NOTE — Telephone Encounter (Signed)
No answers until she returns to office in near future

## 2018-07-22 NOTE — Telephone Encounter (Signed)
Kristie CowmanSusan Hoffman, nurse case manager received the office visit notes from 06/28/18, but still needs the return to work status and referral to physical therapy.  Please fax to #[phone]   Phone #[phone]

## 2018-07-22 NOTE — Telephone Encounter (Signed)
LEFT MESSAGE WITH SUSAN THAT WE FAXED REFERRAL AND A RETURN TO WORK NOTE CANT BE MADE UNTIL PT IS SEEN IN THE OFFICE. I ADVISED TO SET UP FOLLOW UP APPT TO ADDRESS THE RETURN TO WORK NOTE.

## 2018-07-22 NOTE — Telephone Encounter (Signed)
WHEN CAN PT RETURN TO WORK? ANY WORK RESTRICTIONS?

## 2018-09-30 ENCOUNTER — Ambulatory Visit (INDEPENDENT_AMBULATORY_CARE_PROVIDER_SITE_OTHER): Payer: Self-pay | Admitting: Orthopaedic Surgery

## 2018-10-07 ENCOUNTER — Encounter (INDEPENDENT_AMBULATORY_CARE_PROVIDER_SITE_OTHER): Payer: Medicare HMO | Admitting: Ophthalmology

## 2018-10-07 DIAGNOSIS — H35343 Macular cyst, hole, or pseudohole, bilateral: Secondary | ICD-10-CM | POA: Diagnosis not present

## 2018-10-07 DIAGNOSIS — I10 Essential (primary) hypertension: Secondary | ICD-10-CM | POA: Diagnosis not present

## 2018-10-07 DIAGNOSIS — H2513 Age-related nuclear cataract, bilateral: Secondary | ICD-10-CM

## 2018-10-07 DIAGNOSIS — H35033 Hypertensive retinopathy, bilateral: Secondary | ICD-10-CM

## 2018-10-07 DIAGNOSIS — H43823 Vitreomacular adhesion, bilateral: Secondary | ICD-10-CM

## 2018-10-07 DIAGNOSIS — H43813 Vitreous degeneration, bilateral: Secondary | ICD-10-CM

## 2018-10-16 IMAGING — MR MR SHOULDER*R* W/O CM
4 of 5 series · 27 of 40 positions shown · non-contrast
Comparison: None.

CLINICAL DATA: Right shoulder pain since the patient suffered a
trip and fall on carpet 1 year and 7 months ago.

EXAM:
MRI OF THE RIGHT SHOULDER WITHOUT CONTRAST
TECHNIQUE: Multiplanar, multisequence MR imaging of the shoulder was performed.
No intravenous contrast was administered.

[Series 3: T2 fat-sat · axial · 4.0mm · 0.55mm/px · z∈[-31,+54]mm · 8 of 20 slices shown (1 of 3)]
[im 1/20]
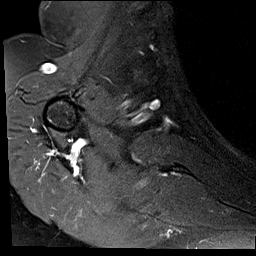
[im 3/20]
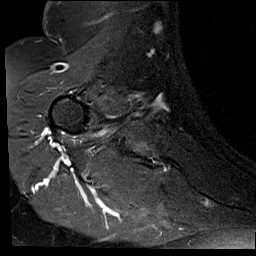
[im 6/20]
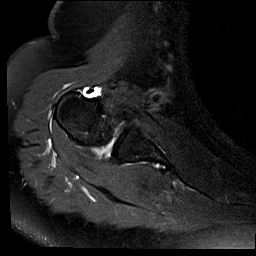
[im 9/20]
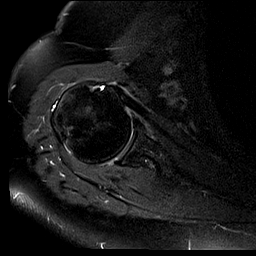
[im 11/20]
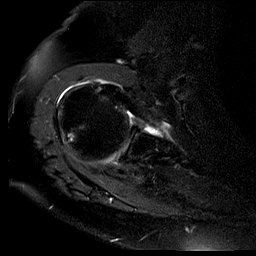
[im 14/20]
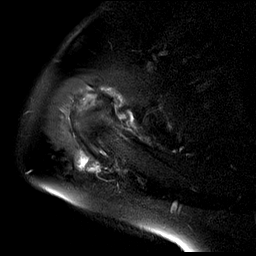
[im 17/20]
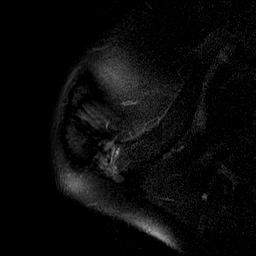
[im 20/20]
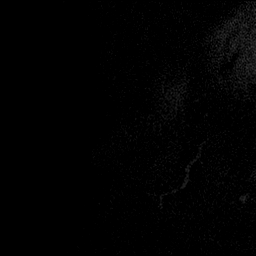

[Series 4: T2 fat-sat · coronal · 4.0mm · 0.55mm/px · 8 of 18 slices shown (2 of 3)]
[im 1/18]
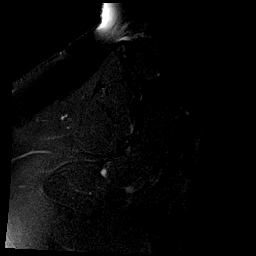
[im 3/18]
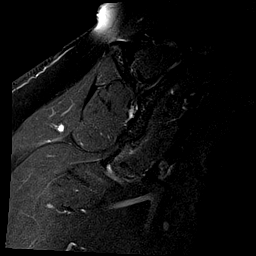
[im 5/18]
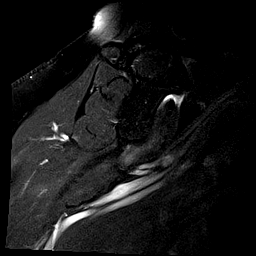
[im 8/18]
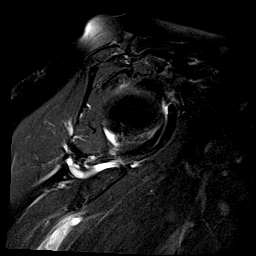
[im 10/18]
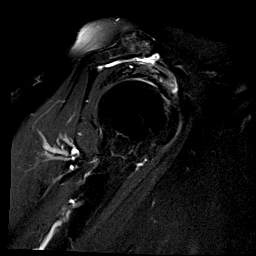
[im 13/18]
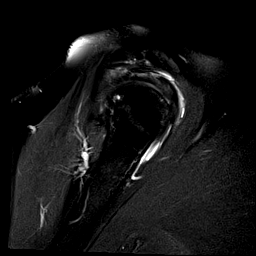
[im 15/18]
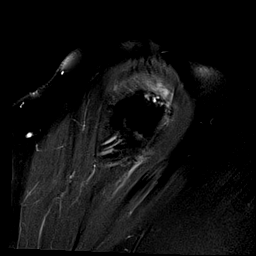
[im 18/18]
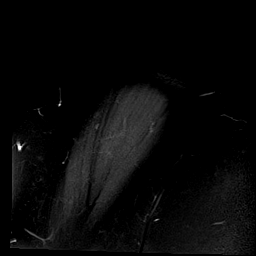

[Series 6: T2 fat-sat · oblique · 4.0mm · 0.27mm/px · 3 of 19 slices shown (3 of 3)]
[im 3/19]
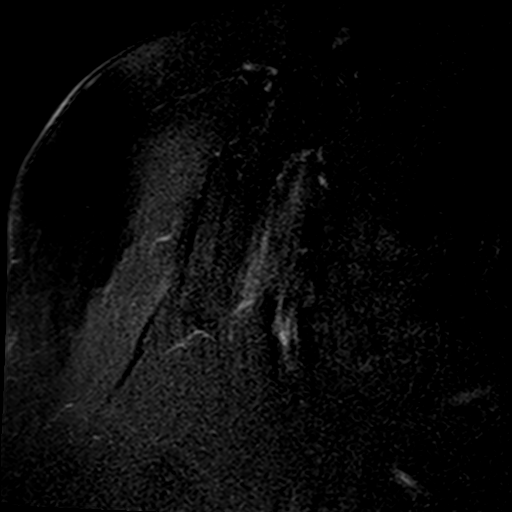
[im 11/19]
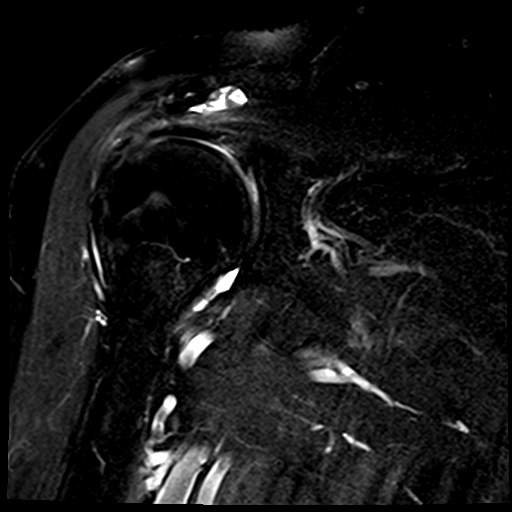
[im 16/19]
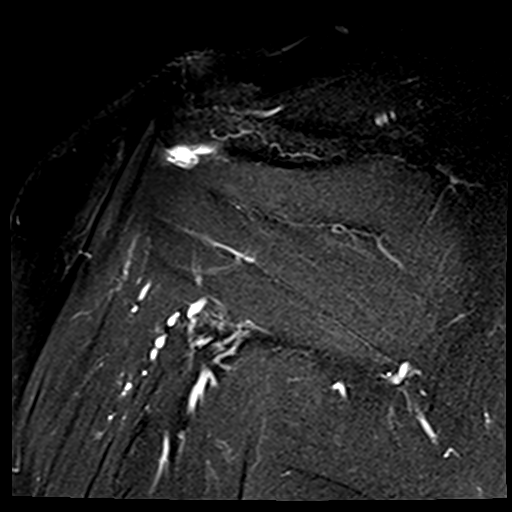

[Series 7: PD · oblique · 4.0mm · 0.22mm/px · 8 of 19 slices shown]
[im 1/19]
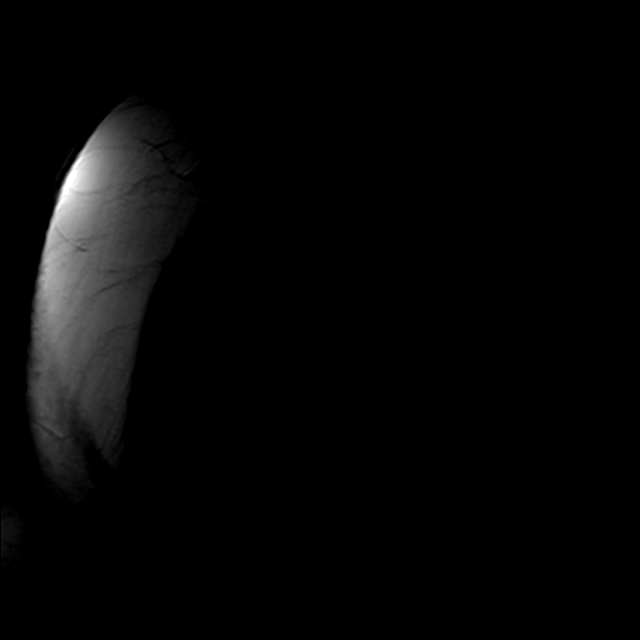
[im 3/19]
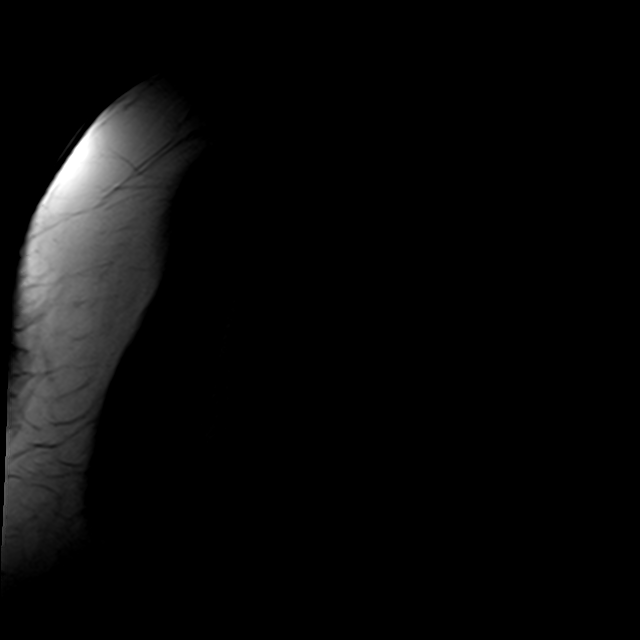
[im 6/19]
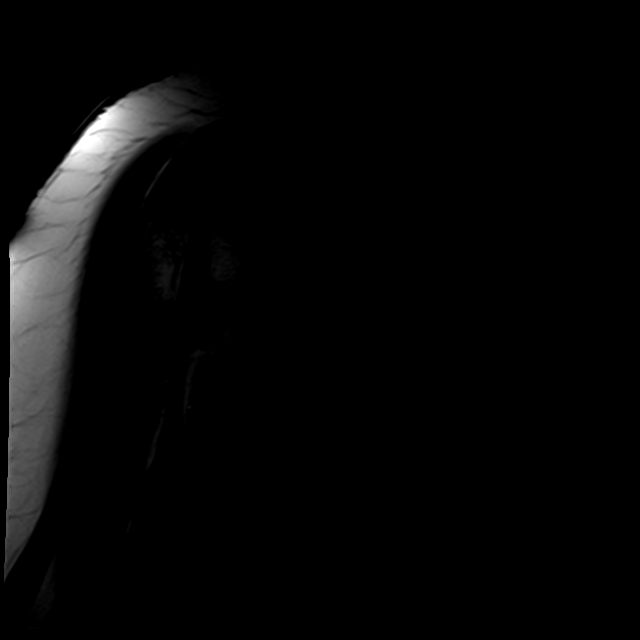
[im 8/19]
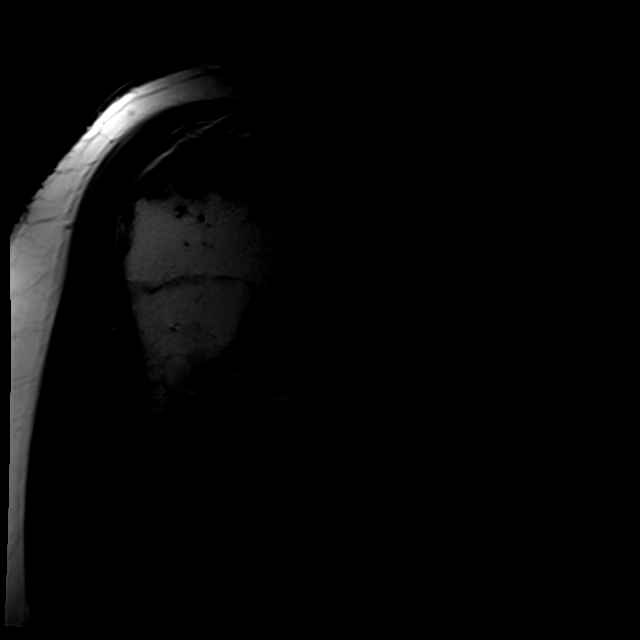
[im 11/19]
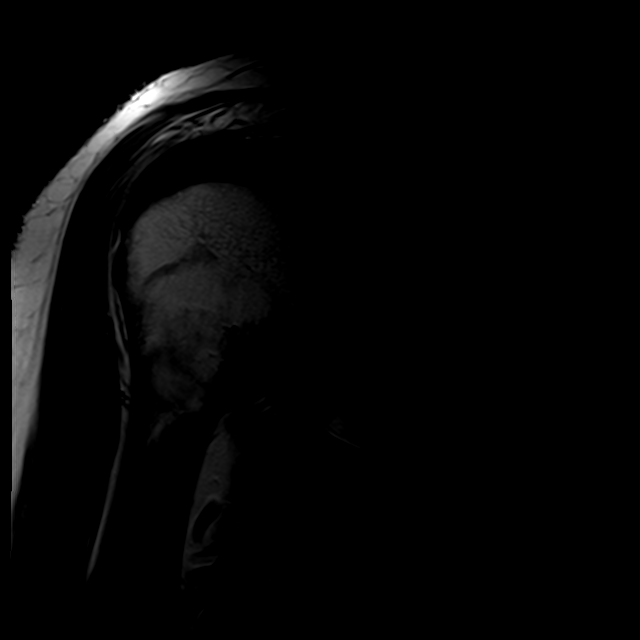
[im 13/19]
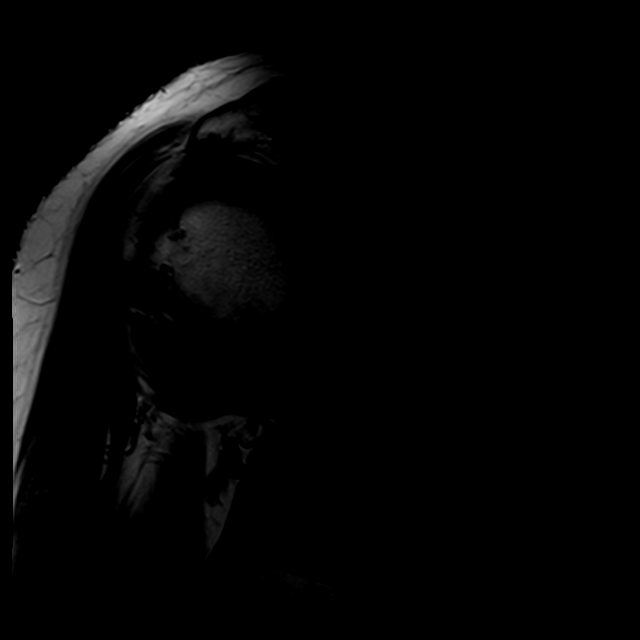
[im 16/19]
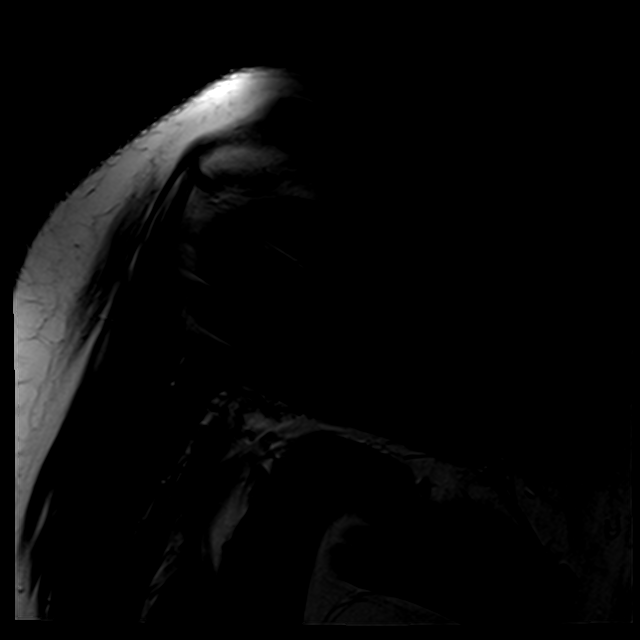
[im 19/19]
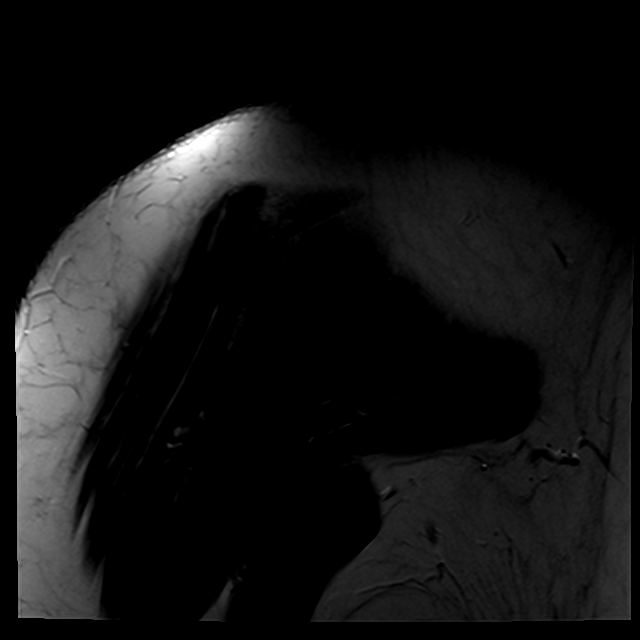

[27 of 40 positions shown; findings below may reference images not displayed]

FINDINGS: Rotator cuff: There is supraspinatus worst infraspinatus and
subscapularis tendinopathy. No tear is identified.

Muscles:  Normal without atrophy or focal lesion.

Biceps long head: Intact. Tendinopathy of the intra-articular
segment noted.

Acromioclavicular Joint: Moderate degenerative change is seen. Type
1 acromion. There is some fluid in the subacromial/subdeltoid bursa.

Glenohumeral Joint: Negative.

Labrum:  Intact.

Bones:  No fracture or worrisome lesion.

Other: None.
IMPRESSION: Rotator cuff tendinopathy without tear appears worst in the
supraspinatus. Mild tendinopathy of the intra-articular long head of
biceps also noted.

Moderate acromioclavicular osteoarthritis.

Subacromial/subdeltoid fluid consistent with bursitis.

## 2019-02-05 ENCOUNTER — Encounter (INDEPENDENT_AMBULATORY_CARE_PROVIDER_SITE_OTHER): Payer: Medicare HMO | Admitting: Ophthalmology

## 2019-02-05 DIAGNOSIS — I10 Essential (primary) hypertension: Secondary | ICD-10-CM | POA: Diagnosis not present

## 2019-02-05 DIAGNOSIS — H43823 Vitreomacular adhesion, bilateral: Secondary | ICD-10-CM | POA: Diagnosis not present

## 2019-02-05 DIAGNOSIS — H2513 Age-related nuclear cataract, bilateral: Secondary | ICD-10-CM

## 2019-02-05 DIAGNOSIS — H35033 Hypertensive retinopathy, bilateral: Secondary | ICD-10-CM | POA: Diagnosis not present

## 2019-05-20 ENCOUNTER — Encounter (INDEPENDENT_AMBULATORY_CARE_PROVIDER_SITE_OTHER): Payer: Medicare HMO | Admitting: Ophthalmology

## 2019-07-24 ENCOUNTER — Other Ambulatory Visit: Payer: Self-pay

## 2019-07-24 DIAGNOSIS — Z20822 Contact with and (suspected) exposure to covid-19: Secondary | ICD-10-CM

## 2019-07-25 LAB — NOVEL CORONAVIRUS, NAA: SARS-CoV-2, NAA: NOT DETECTED

## 2019-07-28 ENCOUNTER — Telehealth: Payer: Self-pay | Admitting: Family Medicine

## 2019-07-28 NOTE — Telephone Encounter (Signed)
Negative COVID results given. Patient results "NOT Detected." Caller expressed understanding. ° °

## 2019-08-20 ENCOUNTER — Encounter (INDEPENDENT_AMBULATORY_CARE_PROVIDER_SITE_OTHER): Payer: Medicare HMO | Admitting: Ophthalmology

## 2020-02-05 ENCOUNTER — Ambulatory Visit: Payer: Medicare HMO | Attending: Internal Medicine

## 2020-02-05 DIAGNOSIS — Z23 Encounter for immunization: Secondary | ICD-10-CM | POA: Insufficient documentation

## 2020-02-05 NOTE — Progress Notes (Signed)
   Covid-19 Vaccination Clinic  Name:  Anaelle Dunton    MRN: [id number] DOB: Mar 13, 1951  02/05/2020  Ms. Gramajo was observed post Covid-19 immunization for 15 minutes without incident. She was provided with Vaccine Information Sheet and instruction to access the V-Safe system.   Ms. Alcivar was instructed to call 911 with any severe reactions post vaccine: Marland Kitchen Difficulty breathing  . Swelling of face and throat  . A fast heartbeat  . A bad rash all over body  . Dizziness and weakness   Immunizations Administered    Name Date Dose VIS Date Route   Moderna COVID-19 Vaccine 02/05/2020 10:52 AM 0.5 mL 11/04/2019 Intramuscular   Manufacturer: Moderna   Lot: [id number]   NDC: 56861-683-72

## 2020-03-09 ENCOUNTER — Ambulatory Visit: Payer: Self-pay

## 2020-03-12 ENCOUNTER — Ambulatory Visit: Payer: Self-pay

## 2020-03-17 ENCOUNTER — Ambulatory Visit: Payer: Self-pay

## 2020-04-07 ENCOUNTER — Ambulatory Visit: Payer: Self-pay

## 2021-02-23 ENCOUNTER — Other Ambulatory Visit: Payer: Self-pay

## 2021-02-23 ENCOUNTER — Encounter (INDEPENDENT_AMBULATORY_CARE_PROVIDER_SITE_OTHER): Payer: Medicare HMO | Admitting: Ophthalmology

## 2021-02-23 DIAGNOSIS — H35341 Macular cyst, hole, or pseudohole, right eye: Secondary | ICD-10-CM | POA: Diagnosis not present

## 2021-02-23 DIAGNOSIS — H35033 Hypertensive retinopathy, bilateral: Secondary | ICD-10-CM | POA: Diagnosis not present

## 2021-02-23 DIAGNOSIS — H43813 Vitreous degeneration, bilateral: Secondary | ICD-10-CM

## 2021-02-23 DIAGNOSIS — I10 Essential (primary) hypertension: Secondary | ICD-10-CM

## 2021-05-11 ENCOUNTER — Other Ambulatory Visit (HOSPITAL_COMMUNITY): Payer: Self-pay | Admitting: Pain Medicine

## 2021-05-11 ENCOUNTER — Other Ambulatory Visit: Payer: Self-pay | Admitting: Pain Medicine

## 2021-05-11 DIAGNOSIS — M5412 Radiculopathy, cervical region: Secondary | ICD-10-CM

## 2021-05-19 ENCOUNTER — Ambulatory Visit (HOSPITAL_COMMUNITY): Payer: Medicare HMO

## 2021-05-19 ENCOUNTER — Encounter (HOSPITAL_COMMUNITY): Payer: Self-pay

## 2022-02-24 ENCOUNTER — Encounter (INDEPENDENT_AMBULATORY_CARE_PROVIDER_SITE_OTHER): Payer: Medicare HMO | Admitting: Ophthalmology

## 2022-03-02 ENCOUNTER — Encounter (INDEPENDENT_AMBULATORY_CARE_PROVIDER_SITE_OTHER): Payer: Medicare HMO | Admitting: Ophthalmology

## 2022-03-02 DIAGNOSIS — H43813 Vitreous degeneration, bilateral: Secondary | ICD-10-CM | POA: Diagnosis not present

## 2022-03-02 DIAGNOSIS — I10 Essential (primary) hypertension: Secondary | ICD-10-CM

## 2022-03-02 DIAGNOSIS — H35341 Macular cyst, hole, or pseudohole, right eye: Secondary | ICD-10-CM

## 2022-03-02 DIAGNOSIS — H2513 Age-related nuclear cataract, bilateral: Secondary | ICD-10-CM

## 2022-03-02 DIAGNOSIS — H35033 Hypertensive retinopathy, bilateral: Secondary | ICD-10-CM

## 2022-07-14 ENCOUNTER — Emergency Department (HOSPITAL_COMMUNITY): Payer: Medicare HMO

## 2022-07-14 ENCOUNTER — Encounter (HOSPITAL_COMMUNITY): Payer: Self-pay | Admitting: Emergency Medicine

## 2022-07-14 ENCOUNTER — Other Ambulatory Visit: Payer: Self-pay

## 2022-07-14 ENCOUNTER — Emergency Department (HOSPITAL_COMMUNITY)
Admission: EM | Admit: 2022-07-14 | Discharge: 2022-07-14 | Disposition: A | Discharge status: Home / Self Care | Payer: Medicare HMO | Attending: Emergency Medicine | Admitting: Emergency Medicine

## 2022-07-14 DIAGNOSIS — R200 Anesthesia of skin: Secondary | ICD-10-CM | POA: Insufficient documentation

## 2022-07-14 DIAGNOSIS — M62838 Other muscle spasm: Secondary | ICD-10-CM | POA: Insufficient documentation

## 2022-07-14 DIAGNOSIS — R519 Headache, unspecified: Secondary | ICD-10-CM | POA: Insufficient documentation

## 2022-07-14 LAB — URINALYSIS, ROUTINE W REFLEX MICROSCOPIC
Bacteria, UA: NONE SEEN
Bilirubin Urine: NEGATIVE
Glucose, UA: NEGATIVE mg/dL
Hgb urine dipstick: NEGATIVE
Ketones, ur: NEGATIVE mg/dL
Nitrite: NEGATIVE
Protein, ur: NEGATIVE mg/dL
Specific Gravity, Urine: 1.013 (ref 1.005–1.030)
pH: 7 (ref 5.0–8.0)

## 2022-07-14 LAB — BASIC METABOLIC PANEL
Anion gap: 4 — ABNORMAL LOW (ref 5–15)
BUN: 20 mg/dL (ref 8–23)
CO2: 26 mmol/L (ref 22–32)
Calcium: 8.8 mg/dL — ABNORMAL LOW (ref 8.9–10.3)
Chloride: 106 mmol/L (ref 98–111)
Creatinine, Ser: 0.97 mg/dL (ref 0.44–1.00)
GFR, Estimated: >60 mL/min (ref >60)
Glucose, Bld: 121 mg/dL — ABNORMAL HIGH (ref 70–99)
Potassium: 4.2 mmol/L (ref 3.5–5.1)
Sodium: 136 mmol/L (ref 135–145)

## 2022-07-14 LAB — CBC
HCT: 36.6 % (ref 36.0–46.0)
Hemoglobin: 11.7 g/dL — ABNORMAL LOW (ref 12.0–15.0)
MCH: 29.7 pg (ref 26.0–34.0)
MCHC: 32 g/dL (ref 30.0–36.0)
MCV: 92.9 fL (ref 80.0–100.0)
Platelets: 254 10*3/uL (ref 150–400)
RBC: 3.94 MIL/uL (ref 3.87–5.11)
RDW: 14.6 % (ref 11.5–15.5)
WBC: 7 10*3/uL (ref 4.0–10.5)
nRBC: 0 % (ref 0.0–0.2)

## 2022-07-14 LAB — CBG MONITORING, ED: Glucose-Capillary: 118 mg/dL — ABNORMAL HIGH (ref 70–99)

## 2022-07-14 NOTE — Discharge Instructions (Addendum)
You were seen in the emergency department for right-sided numbness.  You had lab work and an MRI of your brain that did not show an obvious stroke or other explanation for your symptoms.  We have put a referral in for you to follow-up with neurology.  Please also follow-up with your primary care doctor.  Return to the emergency department if any worsening or concerning symptoms.

## 2022-07-14 NOTE — ED Provider Notes (Signed)
[facility] EMERGENCY DEPARTMENT Provider Note   CSN: [id number] Arrival date & time: 07/14/22  1333     History  Chief Complaint  Patient presents with   Numbness    Christine Gutierrez is a 71 y.o. female.  She was here with a complaint of numbness on her left face left arm and sometimes on her left leg that is been going on for over a week.  She feels like her left side might be a little weaker.  No blurry vision double vision.  Today she noticed a generalized headache and felt like she might have a little bit of difficulty swallowing.  She saw her PCP for this a few days ago and he ordered her an MRI which she has not had yet.  No prior history of stroke.  She said she has had some muscle spasms in her neck and head before but nothing like this.  No fevers chills nausea vomiting.  The history is provided by the patient and the spouse.  Cerebrovascular Accident This is a new problem. The current episode started more than 1 week ago. The problem occurs constantly. The problem has not changed since onset.Associated symptoms include headaches. Pertinent negatives include no chest pain, no abdominal pain and no shortness of breath. Nothing aggravates the symptoms. Nothing relieves the symptoms. She has tried nothing for the symptoms. The treatment provided no relief.       Home Medications Prior to Admission medications   Medication Sig Start Date End Date Taking? Authorizing Provider  diclofenac (VOLTAREN) 75 MG EC tablet Take 75 mg by mouth 2 (two) times daily. 10/23/17   [provider]  DULoxetine (CYMBALTA) 20 MG capsule Take by mouth daily. 12/12/17   [provider]  HYDROcodone-acetaminophen (NORCO) 5-325 MG tablet Take 1 tablet by mouth every 8 (eight) hours as needed for severe pain. Patient not taking: Reported on 06/28/2018 02/28/18   Jacinto Halim, PA-C  levETIRAcetam (KEPPRA) 500 MG tablet Take 500 mg by mouth 2 (two) times daily. 02/14/18   [provider]  levothyroxine (SYNTHROID, LEVOTHROID) 25 MCG tablet levothyroxine 25 mcg tablet  Take 1 tablet every day by oral route for 30 days.    [provider]  losartan (COZAAR) 100 MG tablet  02/11/18   [provider]  losartan (COZAAR) 50 MG tablet losartan 50 mg tablet    [provider]  orphenadrine (NORFLEX) 100 MG tablet TAKE 1 TABLET BY MOUTH EVERY EIGHT HOURS AS NEEDED FOR PAIN OR SPASM 02/14/18   [provider]  pravastatin (PRAVACHOL) 20 MG tablet pravastatin 20 mg tablet  Take 1 tablet every day by oral route for 30 days.    [provider]  valsartan (DIOVAN) 80 MG tablet  06/07/17   [provider]      Allergies    Sulfa antibiotics    Review of Systems   Review of Systems  Constitutional:  Negative for fever.  HENT:  Negative for sore throat.   Eyes:  Negative for visual disturbance.  Respiratory:  Negative for shortness of breath.   Cardiovascular:  Negative for chest pain.  Gastrointestinal:  Negative for abdominal pain.  Genitourinary:  Negative for dysuria.  Musculoskeletal:  Negative for neck pain.  Skin:  Negative for rash.  Neurological:  Positive for weakness, numbness and headaches.    Physical Exam Updated Vital Signs BP (!) 159/57 (BP Location: Right Arm)   Pulse 60   Temp 98 F (36.7  C) (Oral)   Resp 20   Ht 5\' 4"  (1.626 m)   Wt 88 kg   SpO2 95%   BMI 33.30 kg/m  Physical Exam Vitals and nursing note reviewed.  Constitutional:      General: She is not in acute distress.    Appearance: Normal appearance. She is well-developed.  HENT:     Head: Normocephalic and atraumatic.  Eyes:     Conjunctiva/sclera: Conjunctivae normal.  Cardiovascular:     Rate and Rhythm: Normal rate and regular rhythm.     Heart sounds: No murmur heard. Pulmonary:     Effort: Pulmonary effort is normal. No respiratory distress.     Breath sounds: Normal breath sounds.  Abdominal:     Palpations:  Abdomen is soft.     Tenderness: There is no abdominal tenderness.  Musculoskeletal:        General: Normal range of motion.     Cervical back: Neck supple.     Right lower leg: No edema.     Left lower leg: No edema.  Skin:    General: Skin is warm and dry.     Capillary Refill: Capillary refill takes less than 2 seconds.  Neurological:     Mental Status: She is alert.     Sensory: Sensory deficit present.     Motor: No weakness.     Gait: Gait normal.     Comments: She has subjective decrease sensation left face left arm left leg.  No pronator drift.  Normal proprioception.  Psychiatric:        Mood and Affect: Mood normal.     ED Results / Procedures / Treatments   Labs (all labs ordered are listed, but only abnormal results are displayed) Labs Reviewed  BASIC METABOLIC PANEL - Abnormal; Notable for the following components:      Result Value   Glucose, Bld 121 (*)    Calcium 8.8 (*)    Anion gap 4 (*)    All other components within normal limits  CBC - Abnormal; Notable for the following components:   Hemoglobin 11.7 (*)    All other components within normal limits  URINALYSIS, ROUTINE W REFLEX MICROSCOPIC - Abnormal; Notable for the following components:   Leukocytes,Ua TRACE (*)    All other components within normal limits  CBG MONITORING, ED - Abnormal; Notable for the following components:   Glucose-Capillary 118 (*)    All other components within normal limits    EKG EKG Interpretation  Date/Time:  Friday July 14 2022 14:07:19 EDT Ventricular Rate:  56 PR Interval:  180 QRS Duration: 92 QT Interval:  456 QTC Calculation: 441 R Axis:   27 Text Interpretation: Sinus rhythm LVH by voltage No old tracing to compare Confirmed by [id number] [phone] on 07/14/2022 2:26:02 PM  Radiology MR BRAIN WO CONTRAST  Result Date: 07/14/2022 CLINICAL DATA:  Neuro deficit, acute, stroke suspected. Left-sided numbness. EXAM: MRI HEAD WITHOUT CONTRAST TECHNIQUE:  Multiplanar, multiecho pulse sequences of the brain and surrounding structures were obtained without intravenous contrast. COMPARISON:  Head MRI 01/27/2017 FINDINGS: Brain: There is no evidence of an acute infarct, intracranial hemorrhage, mass, midline shift, or extra-axial fluid collection. Patchy T2 hyperintensities in the cerebral white matter and pons have progressed and are nonspecific but compatible with moderately severe chronic small vessel ischemic disease. The ventricles and sulci are within normal limits for age. Vascular: Major intracranial vascular flow voids are preserved. Skull and upper cervical spine: Unremarkable bone marrow  signal. Sinuses/Orbits: Unremarkable orbits. Paranasal sinuses and mastoid air cells are clear. Other: None. IMPRESSION: 1. No acute intracranial abnormality. 2. Chronic small vessel ischemic disease, progressed from 2018. Electronically Signed   By: Sebastian Ache M.D.   On: 07/14/2022 16:08    Procedures Procedures    Medications Ordered in ED Medications - No data to display  ED Course/ Medical Decision Making/ A&P                           Medical Decision Making Amount and/or Complexity of Data Reviewed Labs: ordered. Radiology: ordered.   This patient complains of left-sided numbness possibly weakness; this involves an extensive number of treatment Options and is a complaint that carries with it a high risk of complications and morbidity. The differential includes stroke, bleed, peripheral nerve, metabolic derangement, cord injury  I ordered, reviewed and interpreted labs, which included CBC with normal white count, chemistries normal other than mildly elevated glucose, urinalysis without signs of infection  I ordered imaging studies which included MRI brain and I independently    visualized and interpreted imaging which showed no acute findings Additional history obtained from patient's husband Previous records obtained and reviewed in epic no  recent admissions Cardiac monitoring reviewed, normal sinus rhythm Social determinants considered, no significant barriers Critical Interventions: None  After the interventions stated above, I reevaluated the patient and found patient to be otherwise well-appearing Admission and further testing considered, no indications for admission or further work-up at this time.  We will put in a referral for her to follow-up with neurology and recommended close follow-up with PCP.  Return instructions discussed         Final Clinical Impression(s) / ED Diagnoses Final diagnoses:  Left sided numbness    Rx / DC Orders ED Discharge Orders          Ordered    Ambulatory referral to Neurology       Comments: An appointment is requested in approximately: 2 weeks   07/14/22 1459              Terrilee Files, MD 07/14/22 1800

## 2022-07-14 NOTE — ED Triage Notes (Signed)
Pt presents with numbness on left side on face that has progress left arm and left leg, also having dysphagia, the symptoms started over a week ago.

## 2022-09-01 ENCOUNTER — Emergency Department (HOSPITAL_COMMUNITY)
Admission: EM | Admit: 2022-09-01 | Discharge: 2022-09-02 | Disposition: A | Discharge status: Home / Self Care | Payer: Medicare HMO | Attending: Emergency Medicine | Admitting: Emergency Medicine

## 2022-09-01 ENCOUNTER — Other Ambulatory Visit: Payer: Self-pay

## 2022-09-01 DIAGNOSIS — R202 Paresthesia of skin: Secondary | ICD-10-CM | POA: Diagnosis not present

## 2022-09-01 DIAGNOSIS — Z79899 Other long term (current) drug therapy: Secondary | ICD-10-CM | POA: Insufficient documentation

## 2022-09-01 DIAGNOSIS — I10 Essential (primary) hypertension: Secondary | ICD-10-CM | POA: Diagnosis not present

## 2022-09-01 DIAGNOSIS — R200 Anesthesia of skin: Secondary | ICD-10-CM | POA: Diagnosis present

## 2022-09-01 DIAGNOSIS — R519 Headache, unspecified: Secondary | ICD-10-CM | POA: Diagnosis not present

## 2022-09-01 LAB — CBC WITH DIFFERENTIAL/PLATELET
Abs Immature Granulocytes: 0.03 10*3/uL (ref 0.00–0.07)
Basophils Absolute: 0 10*3/uL (ref 0.0–0.1)
Basophils Relative: 0 %
Eosinophils Absolute: 0.2 10*3/uL (ref 0.0–0.5)
Eosinophils Relative: 2 %
HCT: 38.1 % (ref 36.0–46.0)
Hemoglobin: 12.3 g/dL (ref 12.0–15.0)
Immature Granulocytes: 0 %
Lymphocytes Relative: 27 %
Lymphs Abs: 2.5 10*3/uL (ref 0.7–4.0)
MCH: 30.4 pg (ref 26.0–34.0)
MCHC: 32.3 g/dL (ref 30.0–36.0)
MCV: 94.1 fL (ref 80.0–100.0)
Monocytes Absolute: 0.8 10*3/uL (ref 0.1–1.0)
Monocytes Relative: 9 %
Neutro Abs: 5.9 10*3/uL (ref 1.7–7.7)
Neutrophils Relative %: 62 %
Platelets: 279 10*3/uL (ref 150–400)
RBC: 4.05 MIL/uL (ref 3.87–5.11)
RDW: 14.3 % (ref 11.5–15.5)
WBC: 9.5 10*3/uL (ref 4.0–10.5)
nRBC: 0 % (ref 0.0–0.2)

## 2022-09-01 LAB — BASIC METABOLIC PANEL
Anion gap: 6 (ref 5–15)
BUN: 27 mg/dL — ABNORMAL HIGH (ref 8–23)
CO2: 24 mmol/L (ref 22–32)
Calcium: 8.9 mg/dL (ref 8.9–10.3)
Chloride: 107 mmol/L (ref 98–111)
Creatinine, Ser: 1.13 mg/dL — ABNORMAL HIGH (ref 0.44–1.00)
GFR, Estimated: 52 mL/min — ABNORMAL LOW (ref >60)
Glucose, Bld: 121 mg/dL — ABNORMAL HIGH (ref 70–99)
Potassium: 4.5 mmol/L (ref 3.5–5.1)
Sodium: 137 mmol/L (ref 135–145)

## 2022-09-01 LAB — URINALYSIS, ROUTINE W REFLEX MICROSCOPIC
Bilirubin Urine: NEGATIVE
Glucose, UA: NEGATIVE mg/dL
Hgb urine dipstick: NEGATIVE
Ketones, ur: NEGATIVE mg/dL
Nitrite: NEGATIVE
Protein, ur: NEGATIVE mg/dL
Specific Gravity, Urine: 1.027 (ref 1.005–1.030)
pH: 5 (ref 5.0–8.0)

## 2022-09-01 MED ORDER — ACETAMINOPHEN 500 MG PO TABS
1000.0000 mg | ORAL_TABLET | Freq: Four times a day (QID) | ORAL | Status: DC | PRN
Start: 2022-09-01 — End: 2022-09-02
  Administered 2022-09-01: 1000 mg via ORAL
  Filled 2022-09-01: qty 2

## 2022-09-01 MED ORDER — HYDRALAZINE HCL 10 MG PO TABS
10.0000 mg | ORAL_TABLET | Freq: Once | ORAL | Status: AC
  Administered 2022-09-02: 10 mg via ORAL
  Filled 2022-09-01: qty 1

## 2022-09-01 NOTE — ED Provider Notes (Signed)
[facility] EMERGENCY DEPARTMENT Provider Note   CSN: [id number] Arrival date & time: 09/01/22  1445     History {Add pertinent medical, surgical, social history, OB history to HPI:1} Chief Complaint  Patient presents with   Numbness    Christine Gutierrez is a 71 y.o. female.  HPI     Home Medications Prior to Admission medications   Medication Sig Start Date End Date Taking? Authorizing Provider  diclofenac (VOLTAREN) 75 MG EC tablet Take 75 mg by mouth 2 (two) times daily. 10/23/17   [provider]  DULoxetine (CYMBALTA) 20 MG capsule Take by mouth daily. 12/12/17   [provider]  HYDROcodone-acetaminophen (NORCO) 5-325 MG tablet Take 1 tablet by mouth every 8 (eight) hours as needed for severe pain. Patient not taking: Reported on 06/28/2018 02/28/18   Jillyn Ledger, PA-C  levETIRAcetam (KEPPRA) 500 MG tablet Take 500 mg by mouth 2 (two) times daily. 02/14/18   [provider]  levothyroxine (SYNTHROID, LEVOTHROID) 25 MCG tablet levothyroxine 25 mcg tablet  Take 1 tablet every day by oral route for 30 days.    [provider]  losartan (COZAAR) 100 MG tablet  02/11/18   [provider]  losartan (COZAAR) 50 MG tablet losartan 50 mg tablet    [provider]  orphenadrine (NORFLEX) 100 MG tablet TAKE 1 TABLET BY MOUTH EVERY EIGHT HOURS AS NEEDED FOR PAIN OR SPASM 02/14/18   [provider]  pravastatin (PRAVACHOL) 20 MG tablet pravastatin 20 mg tablet  Take 1 tablet every day by oral route for 30 days.    [provider]  valsartan (DIOVAN) 80 MG tablet  06/07/17   [provider]      Allergies    Sulfa antibiotics    Review of Systems   Review of Systems  Physical Exam Updated Vital Signs BP (!) 222/69   Pulse (!) 59   Temp 97.7 F (36.5 C) (Oral)   Resp 16   SpO2 94%  Physical Exam  ED Results / Procedures / Treatments   Labs (all labs ordered are listed,  but only abnormal results are displayed) Labs Reviewed  BASIC METABOLIC PANEL - Abnormal; Notable for the following components:      Result Value   Glucose, Bld 121 (*)    BUN 27 (*)    Creatinine, Ser 1.13 (*)    GFR, Estimated 52 (*)    All other components within normal limits  URINALYSIS, ROUTINE W REFLEX MICROSCOPIC - Abnormal; Notable for the following components:   Color, Urine AMBER (*)    APPearance HAZY (*)    Leukocytes,Ua TRACE (*)    Bacteria, UA RARE (*)    All other components within normal limits  CBC WITH DIFFERENTIAL/PLATELET    EKG None  Radiology No results found.  Procedures Procedures  {Document cardiac monitor, telemetry assessment procedure when appropriate:1}  Medications Ordered in ED Medications - No data to display  ED Course/ Medical Decision Making/ A&P                           Medical Decision Making  Medical Decision Making  This patient is Presenting for Evaluation of ***, which {Range:23949} require a range of treatment options, and {MDMcomplaint:23950} a complaint that involves a {MDMlevelrisk:23951} risk of morbidity and mortality.  Arrived in ED by: POV*** EMS*** History obtained from: The patient***  Limitations in history: none***  At this time I am most concerned for ***. Also considering ***. Plan for ***    ***I interpreted the ECG. It reveals a sinus rhythm. The QTc, PR, and QRS are appropriate. There are no signs of acute ischemia or of significant electrical abnormalities. The ECG does not show a STEMI. There are no ST depressions. ***There are no T wave inversions. There is no evidence of a High-Grade Conduction Block.   Laboratory work-up significant for:   ***   Radiologic work-up was significant for:  ***   Interventions and Interval History: ***         Consults:  *** Recommendations: ***   Decision rules/scores evaluated: ***   Additional documents reviewed:     Disposition: Due to  the patients current presenting symptoms, physical exam findings, and the workup stated above, it is thought that the etiology of the patients current presentation is ***    ***ADMIT: Patient is thought to require admission for ***. Patient will be admitted to *** service. Please see in patient provider note for additional treatment plan details.   ***Discharge: Patient is felt to be medically appropriate for discharge at this time. Patient was informed of all pertinent physical exam, laboratory, and imaging findings. Patients suspected etiology of their symptom presentation was discussed with the patient and all questions were answered. Patient was instructed to follow up with their primary care doctor in *** days for re-evaluation. Patient was given strict return precautions.   ***Handoff: At the time of signout, the patients *** had not yet been completed. Handoff was provided to Dr. Marland Kitchen , please see their note for additional treatment plan details.   The plan for this patient was discussed with Dr. ***, who voiced agreement and who oversaw evaluation and treatment of this patient.     Clinical Complexity  A medically appropriate history, review of systems, and physical exam was performed.   I personally reviewed the lab and imaging studies discussed above.   MDM generated using voice dictation software and may contain dictation errors. Please contact me for any clarification or with any questions.         {Document critical care time when appropriate:1} {Document review of labs and clinical decision tools ie heart score, Chads2Vasc2 etc:1}  {Document your independent review of radiology images, and any outside records:1} {Document your discussion with family members, caretakers, and with consultants:1} {Document social determinants of health affecting pt's care:1} {Document your decision making why or why not admission, treatments were needed:1} Final Clinical Impression(s) /  ED Diagnoses Final diagnoses:  None    Rx / DC Orders ED Discharge Orders     None

## 2022-09-01 NOTE — Discharge Instructions (Addendum)
Please return to the emergency department if you develop any new or worsening symptoms, slurred speech, loss consciousness, headache, inability to walk or any other concerning symptoms.  Please discuss with your primary care doctor the need to follow-up outpatient with neurology.

## 2022-09-01 NOTE — ED Provider Triage Note (Signed)
Emergency Medicine Provider Triage Evaluation Note  Christine Gutierrez , a 71 y.o. female  was evaluated in triage.  Pt complains of numbness on the left side.  Patient states this been going on since August 11.  She was seen at [facility] for the same and had a negative MRI and CT scan, told to follow-up outpatient with neurology however neurology would not take her because they do not take Worker's Comp. claim.  Her caseworker sent her back to the emergency department.  She complains of numbness on the entire left side of her body.  She states that she has associated episodes of neglect.  She denies any other neuro symptoms..  Review of Systems  Positive: Paresthesia Negative: Fever  Physical Exam  BP (!) 186/82 (BP Location: Right Arm)   Pulse 66   Temp 97.9 F (36.6 C) (Oral)   Resp 18   SpO2 97%  Gen:   Awake, no distress   Resp:  Normal effort  MSK:   Moves extremities without difficulty  Other:  No obvious neurodeficits  Medical Decision Making  Medically screening exam initiated at 3:33 PM.  Appropriate orders placed.  Christine Gutierrez was informed that the remainder of the evaluation will be completed by another provider, this initial triage assessment does not replace that evaluation, and the importance of remaining in the ED until their evaluation is complete.  Work-up initiated   Margarita Mail, PA-C 09/01/22 1534

## 2022-09-01 NOTE — ED Triage Notes (Signed)
Pt here from home for L side numbness x 1.5 months. Pt reports numbness has been intermittent since 2017 but never this bad. Pt has noticed weakness in grip strength in L hand, has frequently dropped items when she's not looking at them. Upon exam pt has numbness to L face, L arm, and L hand, no sensory deficit on lower extremities. No other neuro deficits noted

## 2022-10-10 ENCOUNTER — Ambulatory Visit: Payer: Medicare HMO | Admitting: Neurology

## 2022-11-08 ENCOUNTER — Encounter: Payer: Self-pay | Admitting: Diagnostic Neuroimaging

## 2022-11-08 ENCOUNTER — Ambulatory Visit: Payer: Medicare HMO | Admitting: Diagnostic Neuroimaging

## 2023-02-08 ENCOUNTER — Encounter (INDEPENDENT_AMBULATORY_CARE_PROVIDER_SITE_OTHER): Payer: Medicare HMO | Admitting: Ophthalmology

## 2023-02-22 ENCOUNTER — Encounter (INDEPENDENT_AMBULATORY_CARE_PROVIDER_SITE_OTHER): Payer: Medicare HMO | Admitting: Ophthalmology

## 2023-10-21 ENCOUNTER — Emergency Department (HOSPITAL_COMMUNITY): Payer: Medicare HMO

## 2023-10-21 ENCOUNTER — Inpatient Hospital Stay (HOSPITAL_COMMUNITY): Payer: Medicare HMO

## 2023-10-21 ENCOUNTER — Inpatient Hospital Stay (HOSPITAL_COMMUNITY)
Admission: EM | Admit: 2023-10-21 | Discharge: 2023-10-23 | DRG: 871 | Disposition: A | Discharge status: Home / Self Care | Payer: Medicare HMO | Attending: Internal Medicine | Admitting: Internal Medicine

## 2023-10-21 ENCOUNTER — Encounter (HOSPITAL_COMMUNITY): Payer: Self-pay

## 2023-10-21 ENCOUNTER — Other Ambulatory Visit: Payer: Self-pay

## 2023-10-21 DIAGNOSIS — I480 Paroxysmal atrial fibrillation: Secondary | ICD-10-CM | POA: Diagnosis present

## 2023-10-21 DIAGNOSIS — R6510 Systemic inflammatory response syndrome (SIRS) of non-infectious origin without acute organ dysfunction: Secondary | ICD-10-CM | POA: Diagnosis not present

## 2023-10-21 DIAGNOSIS — J189 Pneumonia, unspecified organism: Secondary | ICD-10-CM | POA: Diagnosis present

## 2023-10-21 DIAGNOSIS — N1832 Chronic kidney disease, stage 3b: Secondary | ICD-10-CM | POA: Diagnosis present

## 2023-10-21 DIAGNOSIS — A419 Sepsis, unspecified organism: Principal | ICD-10-CM | POA: Diagnosis present

## 2023-10-21 DIAGNOSIS — I159 Secondary hypertension, unspecified: Secondary | ICD-10-CM | POA: Diagnosis not present

## 2023-10-21 DIAGNOSIS — Z1152 Encounter for screening for COVID-19: Secondary | ICD-10-CM

## 2023-10-21 DIAGNOSIS — Z7901 Long term (current) use of anticoagulants: Secondary | ICD-10-CM

## 2023-10-21 DIAGNOSIS — I129 Hypertensive chronic kidney disease with stage 1 through stage 4 chronic kidney disease, or unspecified chronic kidney disease: Secondary | ICD-10-CM | POA: Diagnosis present

## 2023-10-21 DIAGNOSIS — R531 Weakness: Secondary | ICD-10-CM | POA: Diagnosis not present

## 2023-10-21 DIAGNOSIS — Z9181 History of falling: Secondary | ICD-10-CM

## 2023-10-21 DIAGNOSIS — R680 Hypothermia, not associated with low environmental temperature: Secondary | ICD-10-CM | POA: Diagnosis present

## 2023-10-21 DIAGNOSIS — M4854XA Collapsed vertebra, not elsewhere classified, thoracic region, initial encounter for fracture: Secondary | ICD-10-CM | POA: Diagnosis present

## 2023-10-21 DIAGNOSIS — D696 Thrombocytopenia, unspecified: Secondary | ICD-10-CM | POA: Diagnosis present

## 2023-10-21 DIAGNOSIS — E872 Acidosis, unspecified: Secondary | ICD-10-CM | POA: Diagnosis present

## 2023-10-21 DIAGNOSIS — Z881 Allergy status to other antibiotic agents status: Secondary | ICD-10-CM | POA: Diagnosis not present

## 2023-10-21 DIAGNOSIS — Z882 Allergy status to sulfonamides status: Secondary | ICD-10-CM | POA: Diagnosis not present

## 2023-10-21 DIAGNOSIS — R6520 Severe sepsis without septic shock: Secondary | ICD-10-CM | POA: Diagnosis present

## 2023-10-21 DIAGNOSIS — E861 Hypovolemia: Secondary | ICD-10-CM

## 2023-10-21 DIAGNOSIS — E860 Dehydration: Secondary | ICD-10-CM | POA: Diagnosis present

## 2023-10-21 DIAGNOSIS — R296 Repeated falls: Secondary | ICD-10-CM | POA: Diagnosis present

## 2023-10-21 DIAGNOSIS — I959 Hypotension, unspecified: Secondary | ICD-10-CM

## 2023-10-21 DIAGNOSIS — Z79899 Other long term (current) drug therapy: Secondary | ICD-10-CM | POA: Diagnosis not present

## 2023-10-21 DIAGNOSIS — G40909 Epilepsy, unspecified, not intractable, without status epilepticus: Secondary | ICD-10-CM | POA: Diagnosis present

## 2023-10-21 DIAGNOSIS — R34 Anuria and oliguria: Secondary | ICD-10-CM | POA: Diagnosis present

## 2023-10-21 DIAGNOSIS — J9601 Acute respiratory failure with hypoxia: Secondary | ICD-10-CM | POA: Diagnosis present

## 2023-10-21 DIAGNOSIS — I4891 Unspecified atrial fibrillation: Secondary | ICD-10-CM | POA: Diagnosis present

## 2023-10-21 DIAGNOSIS — R0789 Other chest pain: Secondary | ICD-10-CM

## 2023-10-21 DIAGNOSIS — D72829 Elevated white blood cell count, unspecified: Secondary | ICD-10-CM

## 2023-10-21 DIAGNOSIS — Z888 Allergy status to other drugs, medicaments and biological substances status: Secondary | ICD-10-CM

## 2023-10-21 DIAGNOSIS — R739 Hyperglycemia, unspecified: Secondary | ICD-10-CM | POA: Diagnosis present

## 2023-10-21 DIAGNOSIS — E039 Hypothyroidism, unspecified: Secondary | ICD-10-CM | POA: Diagnosis present

## 2023-10-21 DIAGNOSIS — Z8249 Family history of ischemic heart disease and other diseases of the circulatory system: Secondary | ICD-10-CM

## 2023-10-21 DIAGNOSIS — Z79890 Hormone replacement therapy (postmenopausal): Secondary | ICD-10-CM

## 2023-10-21 DIAGNOSIS — I10 Essential (primary) hypertension: Secondary | ICD-10-CM | POA: Diagnosis present

## 2023-10-21 DIAGNOSIS — N179 Acute kidney failure, unspecified: Secondary | ICD-10-CM | POA: Diagnosis present

## 2023-10-21 DIAGNOSIS — Z7722 Contact with and (suspected) exposure to environmental tobacco smoke (acute) (chronic): Secondary | ICD-10-CM | POA: Diagnosis present

## 2023-10-21 DIAGNOSIS — E7800 Pure hypercholesterolemia, unspecified: Secondary | ICD-10-CM | POA: Diagnosis present

## 2023-10-21 DIAGNOSIS — I493 Ventricular premature depolarization: Secondary | ICD-10-CM | POA: Diagnosis present

## 2023-10-21 LAB — COMPREHENSIVE METABOLIC PANEL
ALT: 30 U/L (ref 0–44)
AST: 54 U/L — ABNORMAL HIGH (ref 15–41)
Albumin: 2.9 g/dL — ABNORMAL LOW (ref 3.5–5.0)
Alkaline Phosphatase: 111 U/L (ref 38–126)
Anion gap: 17 — ABNORMAL HIGH (ref 5–15)
BUN: 74 mg/dL — ABNORMAL HIGH (ref 8–23)
CO2: 17 mmol/L — ABNORMAL LOW (ref 22–32)
Calcium: 9.1 mg/dL (ref 8.9–10.3)
Chloride: 98 mmol/L (ref 98–111)
Creatinine, Ser: 3.81 mg/dL — ABNORMAL HIGH (ref 0.44–1.00)
GFR, Estimated: 12 mL/min — ABNORMAL LOW (ref >60)
Glucose, Bld: 202 mg/dL — ABNORMAL HIGH (ref 70–99)
Potassium: 3.9 mmol/L (ref 3.5–5.1)
Sodium: 132 mmol/L — ABNORMAL LOW (ref 135–145)
Total Bilirubin: 0.9 mg/dL (ref <1.2)
Total Protein: 7.2 g/dL (ref 6.5–8.1)

## 2023-10-21 LAB — CBC WITH DIFFERENTIAL/PLATELET
Abs Immature Granulocytes: 0.07 10*3/uL (ref 0.00–0.07)
Basophils Absolute: 0.1 10*3/uL (ref 0.0–0.1)
Basophils Relative: 1 %
Eosinophils Absolute: 0 10*3/uL (ref 0.0–0.5)
Eosinophils Relative: 0 %
HCT: 40.2 % (ref 36.0–46.0)
Hemoglobin: 13.3 g/dL (ref 12.0–15.0)
Immature Granulocytes: 1 %
Lymphocytes Relative: 32 %
Lymphs Abs: 4 10*3/uL (ref 0.7–4.0)
MCH: 28.8 pg (ref 26.0–34.0)
MCHC: 33.1 g/dL (ref 30.0–36.0)
MCV: 87 fL (ref 80.0–100.0)
Monocytes Absolute: 0.7 10*3/uL (ref 0.1–1.0)
Monocytes Relative: 6 %
Neutro Abs: 7.5 10*3/uL (ref 1.7–7.7)
Neutrophils Relative %: 60 %
Platelets: 105 10*3/uL — ABNORMAL LOW (ref 150–400)
RBC: 4.62 MIL/uL (ref 3.87–5.11)
RDW: 14.5 % (ref 11.5–15.5)
WBC: 12.4 10*3/uL — ABNORMAL HIGH (ref 4.0–10.5)
nRBC: 0 % (ref 0.0–0.2)

## 2023-10-21 LAB — LACTIC ACID, PLASMA
Lactic Acid, Venous: 2.3 mmol/L (ref 0.5–1.9)
Lactic Acid, Venous: 1.4 mmol/L (ref 0.5–1.9)

## 2023-10-21 LAB — URINALYSIS, ROUTINE W REFLEX MICROSCOPIC
Bacteria, UA: NONE SEEN
Bilirubin Urine: NEGATIVE
Glucose, UA: NEGATIVE mg/dL
Ketones, ur: NEGATIVE mg/dL
Leukocytes,Ua: NEGATIVE
Nitrite: NEGATIVE
Protein, ur: 30 mg/dL — AB
Specific Gravity, Urine: 1.016 (ref 1.005–1.030)
pH: 5 (ref 5.0–8.0)

## 2023-10-21 LAB — PROTIME-INR
INR: 1.1 (ref 0.8–1.2)
Prothrombin Time: 13.9 s (ref 11.4–15.2)

## 2023-10-21 LAB — BLOOD GAS, ARTERIAL
Acid-base deficit: 8.4 mmol/L — ABNORMAL HIGH (ref 0.0–2.0)
Bicarbonate: 16.3 mmol/L — ABNORMAL LOW (ref 20.0–28.0)
Drawn by: 22179
FIO2: 21 %
O2 Saturation: 98.3 %
Patient temperature: 35.6
pCO2 arterial: 29 mm[Hg] — ABNORMAL LOW (ref 32–48)
pH, Arterial: 7.35 (ref 7.35–7.45)
pO2, Arterial: 72 mm[Hg] — ABNORMAL LOW (ref 83–108)

## 2023-10-21 LAB — PROCALCITONIN: Procalcitonin: 2.36 ng/mL

## 2023-10-21 LAB — RESP PANEL BY RT-PCR (RSV, FLU A&B, COVID)  RVPGX2
Influenza A by PCR: NEGATIVE
Influenza B by PCR: NEGATIVE
Resp Syncytial Virus by PCR: NEGATIVE
SARS Coronavirus 2 by RT PCR: NEGATIVE

## 2023-10-21 LAB — APTT: aPTT: 34 s (ref 24–36)

## 2023-10-21 LAB — CK: Total CK: 207 U/L (ref 38–234)

## 2023-10-21 LAB — BRAIN NATRIURETIC PEPTIDE: B Natriuretic Peptide: 108 pg/mL — ABNORMAL HIGH (ref 0.0–100.0)

## 2023-10-21 LAB — MRSA NEXT GEN BY PCR, NASAL: MRSA by PCR Next Gen: NOT DETECTED

## 2023-10-21 LAB — CBG MONITORING, ED: Glucose-Capillary: 177 mg/dL — ABNORMAL HIGH (ref 70–99)

## 2023-10-21 LAB — TROPONIN I (HIGH SENSITIVITY): Troponin I (High Sensitivity): 46 ng/L — ABNORMAL HIGH (ref <18)

## 2023-10-21 LAB — TSH: TSH: 6.254 u[IU]/mL — ABNORMAL HIGH (ref 0.350–4.500)

## 2023-10-21 MED ORDER — SODIUM CHLORIDE 0.9 % IV BOLUS (SEPSIS)
1000.0000 mL | Freq: Once | INTRAVENOUS | Status: AC
  Administered 2023-10-21: 1000 mL via INTRAVENOUS

## 2023-10-21 MED ORDER — ONDANSETRON HCL 4 MG PO TABS: 4.0000 mg | ORAL_TABLET | Freq: Four times a day (QID) | ORAL | Status: DC | PRN

## 2023-10-21 MED ORDER — SODIUM CHLORIDE 0.9 % IV SOLN
2.0000 g | Freq: Once | INTRAVENOUS | Status: AC
  Administered 2023-10-21: 2 g via INTRAVENOUS
  Filled 2023-10-21: qty 20

## 2023-10-21 MED ORDER — METRONIDAZOLE 500 MG/100ML IV SOLN
500.0000 mg | Freq: Two times a day (BID) | INTRAVENOUS | Status: DC
  Administered 2023-10-22 (×2): 500 mg via INTRAVENOUS
  Filled 2023-10-21 (×2): qty 100

## 2023-10-21 MED ORDER — SODIUM CHLORIDE 0.9 % IV BOLUS
1000.0000 mL | Freq: Once | INTRAVENOUS | Status: AC
  Administered 2023-10-21: 1000 mL via INTRAVENOUS

## 2023-10-21 MED ORDER — LEVOTHYROXINE SODIUM 25 MCG PO TABS
25.0000 ug | ORAL_TABLET | Freq: Every day | ORAL | Status: DC
  Administered 2023-10-22 – 2023-10-23 (×2): 25 ug via ORAL
  Filled 2023-10-21 (×2): qty 1

## 2023-10-21 MED ORDER — SODIUM CHLORIDE 0.9 % IV SOLN
2.0000 g | INTRAVENOUS | Status: DC
  Administered 2023-10-21 – 2023-10-22 (×2): 2 g via INTRAVENOUS
  Filled 2023-10-21 (×2): qty 12.5

## 2023-10-21 MED ORDER — ALBUTEROL SULFATE (2.5 MG/3ML) 0.083% IN NEBU
INHALATION_SOLUTION | RESPIRATORY_TRACT | Status: AC
  Administered 2023-10-21: 2.5 mg
  Filled 2023-10-21: qty 3

## 2023-10-21 MED ORDER — CHLORHEXIDINE GLUCONATE CLOTH 2 % EX PADS
6.0000 | MEDICATED_PAD | Freq: Every day | CUTANEOUS | Status: DC
  Administered 2023-10-21 – 2023-10-23 (×3): 6 via TOPICAL

## 2023-10-21 MED ORDER — ACETAMINOPHEN 650 MG RE SUPP: 650.0000 mg | Freq: Four times a day (QID) | RECTAL | Status: DC | PRN

## 2023-10-21 MED ORDER — SODIUM CHLORIDE 0.9 % IV SOLN: INTRAVENOUS | Status: AC

## 2023-10-21 MED ORDER — ACETAMINOPHEN 325 MG PO TABS: 650.0000 mg | ORAL_TABLET | Freq: Four times a day (QID) | ORAL | Status: DC | PRN

## 2023-10-21 MED ORDER — POLYETHYLENE GLYCOL 3350 17 G PO PACK: 17.0000 g | PACK | Freq: Every day | ORAL | Status: DC | PRN

## 2023-10-21 MED ORDER — ONDANSETRON HCL 4 MG/2ML IJ SOLN: 4.0000 mg | Freq: Four times a day (QID) | INTRAMUSCULAR | Status: DC | PRN

## 2023-10-21 MED ORDER — DOXYCYCLINE HYCLATE 100 MG IV SOLR
100.0000 mg | Freq: Two times a day (BID) | INTRAVENOUS | Status: DC
  Administered 2023-10-21 – 2023-10-23 (×4): 100 mg via INTRAVENOUS
  Filled 2023-10-21 (×7): qty 100

## 2023-10-21 MED ORDER — IPRATROPIUM-ALBUTEROL 0.5-2.5 (3) MG/3ML IN SOLN
3.0000 mL | RESPIRATORY_TRACT | Status: DC | PRN
  Administered 2023-10-22: 3 mL via RESPIRATORY_TRACT
  Filled 2023-10-21: qty 3

## 2023-10-21 MED ORDER — HEPARIN SODIUM (PORCINE) 5000 UNIT/ML IJ SOLN
5000.0000 [IU] | Freq: Three times a day (TID) | INTRAMUSCULAR | Status: DC
  Administered 2023-10-21 – 2023-10-23 (×5): 5000 [IU] via SUBCUTANEOUS
  Filled 2023-10-21 (×5): qty 1

## 2023-10-21 MED ORDER — IPRATROPIUM BROMIDE 0.02 % IN SOLN
RESPIRATORY_TRACT | Status: AC
  Administered 2023-10-21: 0.5 mg
  Filled 2023-10-21: qty 2.5

## 2023-10-21 MED ORDER — SODIUM CHLORIDE 0.9 % IV BOLUS (SEPSIS)
1000.0000 mL | Freq: Once | INTRAVENOUS | Status: AC
Start: 2023-10-21 — End: 2023-10-21
  Administered 2023-10-21: 1000 mL via INTRAVENOUS

## 2023-10-21 NOTE — Assessment & Plan Note (Signed)
Initial hypotension blood pressure systolic down to 69. -Hold valsartan, Norvasc

## 2023-10-21 NOTE — ED Triage Notes (Signed)
Pt c/o having a UTI over two weeks ago was given a shot of antibiotic. Pt states having fever, chills, and nausea. Pt states still having urinary retention problems. According to pt PCP informed her she needed to go see a nephrologist b/c he believed she was in kidney failure.

## 2023-10-21 NOTE — Assessment & Plan Note (Addendum)
Generalized weakness, patient appears dehydrated, with hypotension, poor oral intake over the past several days.  Question ongoing UTI.  Head CT negative for acute abnormality.  CT chest and abdomen without suggestion for infectious etiology at this time. -TSH mildly elevated-6.2, in the setting of acute illness

## 2023-10-21 NOTE — ED Provider Notes (Signed)
[facility] AT [facility] Provider Note   CSN: [id number] Arrival date & time: 10/21/23  1442     History  Chief Complaint  Patient presents with   Fatigue    Christine Gutierrez is a 72 y.o. female.  HPI   This patient is a 72 year old female, she has a history of hypertension, high cholesterol and seizure disorder on Keppra, she was in her usual state of health until couple weeks ago when she developed urinary symptoms, she reports that she was seen at an outside hospital system at her family doctor and was diagnosed with a urinary tract infection, she was started on an antibiotic and felt like her urinary symptoms were getting better.  She then had a feeling of gradually getting worse over the last week in fact at 1 point she fell out of bed and was so weak she could not get up so she laid on the bed for 3 days, she was finally able to get back to the bed but is progressively become weaker nauseated and has diffuse abdominal pain.  On arrival the patient is diaphoretic pale hypotensive at 69/51, mildly bradycardic, afebrile and extremely ill-appearing. she arrives by private transport, her significant other drove her to the hospital.  Home Medications Prior to Admission medications   Medication Sig Start Date End Date Taking? Authorizing Provider  amLODipine (NORVASC) 5 MG tablet Take 1 tablet by mouth daily. 12/21/22  Yes [provider]  tiZANidine (ZANAFLEX) 4 MG tablet Take 1 tablet by mouth 2 (two) times daily as needed. 05/23/22  Yes [provider]  amLODipine (NORVASC) 2.5 MG tablet Take 1 tablet by mouth daily.    [provider]  amoxicillin (AMOXIL) 875 MG tablet Take 875 mg by mouth 2 (two) times daily. For 7 days 10/08/23   [provider]  diclofenac (VOLTAREN) 75 MG EC tablet Take 75 mg by mouth 2 (two) times daily. 10/23/17   [provider]  DULoxetine (CYMBALTA) 20 MG capsule Take by mouth  daily. 12/12/17   [provider]  gabapentin (NEURONTIN) 300 MG capsule Take 1 capsule by mouth 2 (two) times daily.    [provider]  HYDROcodone-acetaminophen (NORCO) 5-325 MG tablet Take 1 tablet by mouth every 8 (eight) hours as needed for severe pain. Patient not taking: Reported on 06/28/2018 02/28/18   Jacinto Halim, PA-C  levETIRAcetam (KEPPRA) 500 MG tablet Take 500 mg by mouth 2 (two) times daily. 02/14/18   [provider]  levothyroxine (SYNTHROID, LEVOTHROID) 25 MCG tablet levothyroxine 25 mcg tablet  Take 1 tablet every day by oral route for 30 days.    [provider]  losartan (COZAAR) 100 MG tablet  02/11/18   [provider]  losartan (COZAAR) 50 MG tablet losartan 50 mg tablet    [provider]  orphenadrine (NORFLEX) 100 MG tablet TAKE 1 TABLET BY MOUTH EVERY EIGHT HOURS AS NEEDED FOR PAIN OR SPASM 02/14/18   [provider]  pravastatin (PRAVACHOL) 20 MG tablet pravastatin 20 mg tablet  Take 1 tablet every day by oral route for 30 days.    [provider]  valsartan (DIOVAN) 160 MG tablet Take 160 mg by mouth daily. 05/23/22   [provider]      Allergies    Nitrofurantoin, Sulfa antibiotics, and Statins    Review of Systems   Review of Systems  All other systems reviewed and are negative.   Physical Exam  Updated Vital Signs BP (!) 100/54   Pulse 64   Temp (!) 96.1 F (35.6 C)   Resp 18   Ht 1.626 m (5\' 4" )   Wt 88 kg   SpO2 93%   BMI 33.30 kg/m  Physical Exam Vitals and nursing note reviewed.  Constitutional:      General: She is in acute distress.     Appearance: She is well-developed. She is ill-appearing, toxic-appearing and diaphoretic.  HENT:     Head: Normocephalic and atraumatic.     Nose: No congestion or rhinorrhea.     Mouth/Throat:     Pharynx: No oropharyngeal exudate.  Eyes:     General: No scleral icterus.       Right eye: No discharge.        Left  eye: No discharge.     Conjunctiva/sclera: Conjunctivae normal.     Pupils: Pupils are equal, round, and reactive to light.  Neck:     Thyroid: No thyromegaly.     Vascular: No JVD.  Cardiovascular:     Rate and Rhythm: Normal rate and regular rhythm.     Heart sounds: Normal heart sounds. No murmur heard.    No friction rub. No gallop.  Pulmonary:     Effort: Pulmonary effort is normal. No respiratory distress.     Breath sounds: Normal breath sounds. No wheezing or rales.  Abdominal:     General: Bowel sounds are normal. There is no distension.     Palpations: Abdomen is soft. There is no mass.     Tenderness: There is abdominal tenderness.     Comments: Mild nonspecific abdominal tenderness, no guarding no peritoneal signs  Musculoskeletal:        General: No tenderness. Normal range of motion.     Cervical back: Normal range of motion and neck supple.     Right lower leg: No edema.     Left lower leg: No edema.  Lymphadenopathy:     Cervical: No cervical adenopathy.  Skin:    General: Skin is warm.     Findings: No erythema or rash.  Neurological:     Coordination: Coordination normal.     Comments:   Severe she is able to answer my questions with a very weak generalized weakness, the patient can barely lift either arm off the bed, voice, she does not have any focalizing weakness  Psychiatric:        Behavior: Behavior normal.     ED Results / Procedures / Treatments   Labs (all labs ordered are listed, but only abnormal results are displayed) Labs Reviewed  LACTIC ACID, PLASMA - Abnormal; Notable for the following components:      Result Value   Lactic Acid, Venous 2.3 (*)    All other components within normal limits  COMPREHENSIVE METABOLIC PANEL - Abnormal; Notable for the following components:   Sodium 132 (*)    CO2 17 (*)    Glucose, Bld 202 (*)    BUN 74 (*)    Creatinine, Ser 3.81 (*)    Albumin 2.9 (*)    AST 54 (*)    GFR, Estimated 12 (*)    Anion  gap 17 (*)    All other components within normal limits  CBC WITH DIFFERENTIAL/PLATELET - Abnormal; Notable for the following components:   WBC 12.4 (*)    Platelets 105 (*)    All other components within normal limits  TSH - Abnormal; Notable for the  following components:   TSH 6.254 (*)    All other components within normal limits  CBG MONITORING, ED - Abnormal; Notable for the following components:   Glucose-Capillary 177 (*)    All other components within normal limits  TROPONIN I (HIGH SENSITIVITY) - Abnormal; Notable for the following components:   Troponin I (High Sensitivity) 46 (*)    All other components within normal limits  CULTURE, BLOOD (ROUTINE X 2)  CULTURE, BLOOD (ROUTINE X 2)  RESP PANEL BY RT-PCR (RSV, FLU A&B, COVID)  RVPGX2  PROTIME-INR  APTT  CK  LACTIC ACID, PLASMA  URINALYSIS, W/ REFLEX TO CULTURE (INFECTION SUSPECTED)  BLOOD GAS, ARTERIAL  BRAIN NATRIURETIC PEPTIDE    EKG EKG Interpretation Date/Time:  Sunday October 21 2023 15:26:59 EST Ventricular Rate:  58 PR Interval:    QRS Duration:  94 QT Interval:  412 QTC Calculation: 404 R Axis:   7  Text Interpretation: Atrial fibrillation with slow ventricular response with a competing junctional pacemaker Moderate voltage criteria for LVH, may be normal variant ( R in aVL , [employer] product ) Nonspecific ST and T wave abnormality Abnormal ECG When compared with ECG of 01-Sep-2022 15:02, Atrial fibrillation has replaced Sinus rhythm Nonspecific T wave abnormality, worse in Lateral leads Confirmed by Eber Hong ([id number]) on 10/21/2023 3:39:23 PM  Radiology CT Head Wo Contrast  Result Date: 10/21/2023 CLINICAL DATA:  Polytrauma, blunt EXAM: CT HEAD WITHOUT CONTRAST TECHNIQUE: Contiguous axial images were obtained from the base of the skull through the vertex without intravenous contrast. RADIATION DOSE REDUCTION: This exam was performed according to the departmental dose-optimization program which  includes automated exposure control, adjustment of the mA and/or kV according to patient size and/or use of iterative reconstruction technique. COMPARISON:  MRI head August 11, 23. FINDINGS: Brain: Patchy white matter no evidence of acute infarction, hemorrhage, hydrocephalus, extra-axial collection or mass lesion/mass effect. Hypodensities are nonspecific but compatible with chronic microvascular ischemic change. Vascular: Calcific atherosclerosis. Skull: No acute fracture. Sinuses/Orbits: Clear sinuses.  No acute findings. Other: No mastoid effusions. IMPRESSION: No evidence of acute intracranial abnormality. Electronically Signed   By: Feliberto Harts M.D.   On: 10/21/2023 16:33    Procedures .Critical Care  Performed by: Eber Hong, MD Authorized by: Eber Hong, MD   Critical care provider statement:    Critical care time (minutes):  45   Critical care time was exclusive of:  Separately billable procedures and treating other patients and teaching time   Critical care was necessary to treat or prevent imminent or life-threatening deterioration of the following conditions:  Renal failure and dehydration   Critical care was time spent personally by me on the following activities:  Development of treatment plan with patient or surrogate, discussions with consultants, evaluation of patient's response to treatment, examination of patient, obtaining history from patient or surrogate, review of old charts, re-evaluation of patient's condition, pulse oximetry, ordering and review of radiographic studies, ordering and review of laboratory studies and ordering and performing treatments and interventions   I assumed direction of critical care for this patient from another provider in my specialty: no     Care discussed with: admitting provider   Comments:           Medications Ordered in ED Medications  sodium chloride 0.9 % bolus 1,000 mL (0 mLs Intravenous Stopped 10/21/23 1608)    And   sodium chloride 0.9 % bolus 1,000 mL (1,000 mLs Intravenous New Bag/Given 10/21/23 1559)  And  sodium chloride 0.9 % bolus 1,000 mL (has no administration in time range)  cefTRIAXone (ROCEPHIN) 2 g in sodium chloride 0.9 % 100 mL IVPB (2 g Intravenous New Bag/Given 10/21/23 1608)    ED Course/ Medical Decision Making/ A&P                                 Medical Decision Making Amount and/or Complexity of Data Reviewed Labs: ordered. Radiology: ordered. ECG/medicine tests: ordered.  Risk Decision regarding hospitalization.    This patient presents to the ED for concern of severe weakness and hypotension, this involves an extensive number of treatment options, and is a complaint that carries with it a high risk of complications and morbidity.  The differential diagnosis includes sepsis, kidney failure, rhabdomyolysis, dehydration, electrolyte abnormalities   Co morbidities that complicate the patient evaluation  Multiple comorbidities including hypertension high cholesterol progressive weakness and recent infection   Additional history obtained:  Additional history obtained from review the medical record including prior office visits External records from outside source obtained and reviewed including recent admissions to the hospital date back to several years ago, she has not been admitted recently.  She does report having a fracture of her left humerus several months ago, this has been treated nonoperatively   Lab Tests:  I Ordered, and personally interpreted labs.  The pertinent results include: Acute renal failure with a significant elevation in the creatinine from baseline, BUN is in the 70s, leukocytosis of 12,000 and a lactate of 2.2   Imaging Studies ordered:  I ordered imaging studies including chest x-ray which shows likely pulmonary edema, CT scan of the head is negative and a pelvic x-ray which shows no signs of fracture I independently visualized and  interpreted imaging which showed as above I agree with the radiologist interpretation   Cardiac Monitoring: / EKG:  The patient was maintained on a cardiac monitor.  I personally viewed and interpreted the cardiac monitored which showed an underlying rhythm of: Sinus rhythm   Consultations Obtained:  I requested consultation with the hospitalist,  and discussed lab and imaging findings as well as pertinent plan - they recommend: Admission to the hospital   Problem List / ED Course / Critical interventions / Medication management  Patient is critically ill with hypotension requiring fluid resuscitation.  I placed an 18-gauge IV in the patient's left arm, nursing placed a right sided 20-gauge IV.  30 cc/kg of IV fluids was initiated out of concern for septic shock.  The patient will aggressively have labs drawn, urinalysis obtained, chest x-ray obtained, she did fall and hit her head she thinks so she will need a CT scan of the brain as well as x-rays of the chest and the pelvis.  The patient will likely need to be admitted to the hospital to a higher level of care she does appear critically ill. I ordered medication including IV fluids at 30 cc/kg for renal failure Reevaluation of the patient after these medicines showed that the patient improved blood pressure I have reviewed the patients home medicines and have made adjustments as needed   Social Determinants of Health:  This patient is critically ill, it is unclear exactly what is driving this but she has severely an uric, Foley catheter placed but no return of urine at all, bedside bladder scan shows nothing in the bladder.  I suspect that she is severely dehydrated which would  cause her hypotension and shock like state.  Thankfully the lactate is less than 4, she does not have a fever in fact she is slightly hypothermic and did require active rewarming   Test / Admission - Considered:  Admit to higher level of  care         Final Clinical Impression(s) / ED Diagnoses Final diagnoses:  Acute renal failure, unspecified acute renal failure type (HCC)  Hypotension, unspecified hypotension type  Anuria  Leukocytosis, unspecified type    Rx / DC Orders ED Discharge Orders     None         Eber Hong, MD 10/21/23 1642

## 2023-10-21 NOTE — ED Notes (Signed)
EDP called to Triage for quick assessment.

## 2023-10-21 NOTE — Assessment & Plan Note (Addendum)
Hypothermic 95.7, leukocytosis of 12.5.  With evidence of endorgan dysfunction, lactic acidosis of 2.3 >> 1.4 and AKI on CKD.  Treated for UTI 2 weeks ago with amoxicillin.  No prior urine cultures on file.  TSH mildly elevated at 6.25 in the setting of acute illness -doubt this is severe enough to cause hypothermia. -Warming blanket -UA still pending -Antibiotics given, urine cultures ordered -Follow-up blood cultures -Chest x-ray with edema or infection, subsequent CT chest without contrast dependent atelectasis in lower lobes -Check procalcitonin -Addendum UA not suggestive of UTI, hence no focus of infection identified at this time.  Procalcitonin elevated, will start broad-spectrum antibiotics IV cefepime and metronidazole, will use IV doxycycline instead of IV vancomycin due to AKI and anuria.

## 2023-10-21 NOTE — Assessment & Plan Note (Addendum)
Cr elevated at 3.81, last checked a year ago creatinine was 1.1.  Patient reports recent diagnosis of CKD 3 and subsequently stage II within the past 6 months.  Currently anuric over the past couple of days at least.  Reports barely any oral intake over the past few days.  No vomiting or diarrhea.  CT abdomen and pelvis with normal urinary tract, unfortunately Foley was placed in the vagina. - 4th Liter bolus given, fluid challenge -Continue N/s 75cc/hr x  20hrs -Hold valsartan - In and out cath for now, hold off on Foley

## 2023-10-21 NOTE — ED Notes (Signed)
Patient transported to CT 

## 2023-10-21 NOTE — Assessment & Plan Note (Signed)
Appears to be new diagnosis.  Prior EKGs do not show patient atrial fibrillation.  Heart rate currently 56-71.  A-fib in the setting of hypotension, possible severe sepsis-pending rule out of further workup. CHAD2Vasc score- at least 3 for age, sex and HTN hx. -Hold off on anticoagulation at this time.

## 2023-10-21 NOTE — H&P (Addendum)
History and Physical    Christine Gutierrez ONG:[id number] DOB: 22-Mar-1951 DOA: 10/21/2023  PCP: Lorelei Pont, DO   Patient coming from: Home  I have personally briefly reviewed patient's old medical records in [facility]  Chief Complaint: Weakness, fever  HPI: Christine Gutierrez is a 72 y.o. female with medical history significant for hypertension, dyslipidemia, CKD. Patient was brought to the ED with reports of generalized weakness, subjective fevers.  Patient reports a couple of weeks ago, she was diagnosed with urinary tract infection, and was placed on amoxicillin 875 mg twice daily was compliant with and took for 7 days.  She does not know if urine cultures were drawn.  She reports initially she felt better, then subsequently started feeling worse.  She reports subjective fevers.  About 5 days ago, she fell out of bed, she was on the floor for 3 days.  She reports that any attempt by husband to lift her up, she would scream due to generalized body pain.  Family were finally able to get her up, and then she remained in bed for another 2 days.  She reports poor oral intake for the past several days.  She reports she has not made any urine over the past few days.  No vomiting no diarrhea. She also reports onset of difficulty breathing over the past few days, and a mild cough.   No confused speech.  ED Course: Temperature down to 95.7, heart rate 57-71.  Respiratory rate 18-23.  Blood pressure systolic down to 44/01, improved with fluids. Lactic acid 2.3.  WBC 12.4.  Creatinine elevated at 3.81.  Troponin 46.  CK normal 207.  ABG shows pH of 7.3. Chest x-ray shows edema or infection. Portable pelvic x-ray negative. UA pending, no urine sample available. Blood cultures obtained. IV ceftriaxone 2g given.  3 L bolus given. Hospitalist to admit for AKI, SIRs.   Review of Systems: As per HPI all other systems reviewed and negative.  Past Medical History:  Diagnosis Date    Hypertension     Past Surgical History:  Procedure Laterality Date   RECTO-VAGINAL FISSURE REPAIR       reports that she is a non-smoker but has been exposed to tobacco smoke. She has never used smokeless tobacco. She reports that she does not drink alcohol and does not use drugs.  Allergies  Allergen Reactions   Nitrofurantoin Nausea Only   Sulfa Antibiotics Anaphylaxis    Pt doesn't believe she is allergic to this   Statins Other (See Comments)   Family history of hypertension.  Prior to Admission medications   Medication Sig Start Date End Date Taking? Authorizing Provider  amLODipine (NORVASC) 2.5 MG tablet Take 1 tablet by mouth daily.   Yes [provider]  amLODipine (NORVASC) 5 MG tablet Take 1 tablet by mouth daily. 12/21/22  Yes [provider]  amoxicillin (AMOXIL) 875 MG tablet Take 875 mg by mouth 2 (two) times daily. For 7 days 10/08/23  Yes [provider]  diclofenac (VOLTAREN) 75 MG EC tablet Take 75 mg by mouth daily as needed for mild pain (pain score 1-3). 10/23/17  Yes [provider]  levothyroxine (SYNTHROID, LEVOTHROID) 25 MCG tablet Take 25 mcg by mouth daily before breakfast.   Yes [provider]  valsartan (DIOVAN) 160 MG tablet Take 160 mg by mouth daily. 05/23/22  Yes [provider]    Physical Exam: Vitals:   10/21/23 1730 10/21/23 1800 10/21/23 1815 10/21/23 1900  BP: (!) 119/54 (!) 112/56 (!) 116/50 (!) 106/58  Pulse: 66 67 71 71  Resp: (!) 23 19 18  (!) 23  Temp: (!) 96.5 F (35.8 C) (!) 97 F (36.1 C) (!) 97.2 F (36.2 C) (!) 97.5 F (36.4 C)  TempSrc:    Bladder  SpO2: 93% 94% 93%   Weight:      Height:        Constitutional: NAD, calm, comfortable Vitals:   10/21/23 1730 10/21/23 1800 10/21/23 1815 10/21/23 1900  BP: (!) 119/54 (!) 112/56 (!) 116/50 (!) 106/58  Pulse: 66 67 71 71  Resp: (!) 23 19 18  (!) 23  Temp: (!) 96.5 F (35.8 C) (!) 97 F (36.1 C) (!) 97.2 F (36.2 C)  (!) 97.5 F (36.4 C)  TempSrc:    Bladder  SpO2: 93% 94% 93%   Weight:      Height:       Eyes: PERRL, lids and conjunctivae normal ENMT: Mucous membranes are markedly dry Neck: normal, supple, no masses, no thyromegaly Respiratory: clear to auscultation bilaterally, no wheezing, no crackles. Normal respiratory effort. No accessory muscle use.  Cardiovascular: Regular rate and rhythm, no murmurs / rubs / gallops. No extremity edema.  Extremities warm.  Abdomen: no tenderness, no masses palpated. No hepatosplenomegaly. Bowel sounds positive.  Foley catheter in place.  No urine output Musculoskeletal: no clubbing / cyanosis. No joint deformity upper and lower extremities.  Skin: no rashes, lesions, ulcers. No induration Neurologic:  No facial symmetry, moving extremities spontaneously, speech fluent. Psychiatric: Normal judgment and insight. Alert and oriented x 3. Normal mood.   Labs on Admission: I have personally reviewed following labs and imaging studies  CBC: Recent Labs  Lab 10/21/23 1544  WBC 12.4*  NEUTROABS 7.5  HGB 13.3  HCT 40.2  MCV 87.0  PLT 105*   Basic Metabolic Panel: Recent Labs  Lab 10/21/23 1544  NA 132*  K 3.9  CL 98  CO2 17*  GLUCOSE 202*  BUN 74*  CREATININE 3.81*  CALCIUM 9.1   GFR: Estimated Creatinine Clearance: 14.1 mL/min (A) (by C-G formula based on SCr of 3.81 mg/dL (H)). Liver Function Tests: Recent Labs  Lab 10/21/23 1544  AST 54*  ALT 30  ALKPHOS 111  BILITOT 0.9  PROT 7.2  ALBUMIN 2.9*   Coagulation Profile: Recent Labs  Lab 10/21/23 1544  INR 1.1   Cardiac Enzymes: Recent Labs  Lab 10/21/23 1544  CKTOTAL 207   CBG: Recent Labs  Lab 10/21/23 1523  GLUCAP 177*   Thyroid Function Tests: Recent Labs    10/21/23 1544  TSH 6.254*   Urine analysis:    Component Value Date/Time   COLORURINE AMBER (A) 09/01/2022 1445   APPEARANCEUR HAZY (A) 09/01/2022 1445   LABSPEC 1.027 09/01/2022 1445   PHURINE 5.0  09/01/2022 1445   GLUCOSEU NEGATIVE 09/01/2022 1445   HGBUR NEGATIVE 09/01/2022 1445   BILIRUBINUR NEGATIVE 09/01/2022 1445   KETONESUR NEGATIVE 09/01/2022 1445   PROTEINUR NEGATIVE 09/01/2022 1445   NITRITE NEGATIVE 09/01/2022 1445   LEUKOCYTESUR TRACE (A) 09/01/2022 1445    Radiological Exams on Admission: DG Chest Port 1 View  Result Date: 10/21/2023 CLINICAL DATA:  Sepsis EXAM: PORTABLE CHEST 1 VIEW COMPARISON:  None Available. FINDINGS: Heart size is mildly enlarged. Aortic atherosclerosis. Mild diffuse interstitial prominence. No appreciable pleural fluid collection. No pneumothorax. IMPRESSION: Mild diffuse interstitial prominence, which may reflect edema or atypical/viral infection. Electronically Signed   By: Duanne Guess D.O.  On: 10/21/2023 16:46   DG Pelvis Portable  Result Date: 10/21/2023 CLINICAL DATA:  Fever chills and nausea. EXAM: PORTABLE PELVIS 1-2 VIEWS COMPARISON:  None Available. FINDINGS: There is no evidence of pelvic fracture or diastasis. No pelvic bone lesions are seen. Normal bowel gas pattern. IMPRESSION: Negative. Electronically Signed   By: Ted Mcalpine M.D.   On: 10/21/2023 16:39   CT Head Wo Contrast  Result Date: 10/21/2023 CLINICAL DATA:  Polytrauma, blunt EXAM: CT HEAD WITHOUT CONTRAST TECHNIQUE: Contiguous axial images were obtained from the base of the skull through the vertex without intravenous contrast. RADIATION DOSE REDUCTION: This exam was performed according to the departmental dose-optimization program which includes automated exposure control, adjustment of the mA and/or kV according to patient size and/or use of iterative reconstruction technique. COMPARISON:  MRI head August 11, 23. FINDINGS: Brain: Patchy white matter no evidence of acute infarction, hemorrhage, hydrocephalus, extra-axial collection or mass lesion/mass effect. Hypodensities are nonspecific but compatible with chronic microvascular ischemic change. Vascular:  Calcific atherosclerosis. Skull: No acute fracture. Sinuses/Orbits: Clear sinuses.  No acute findings. Other: No mastoid effusions. IMPRESSION: No evidence of acute intracranial abnormality. Electronically Signed   By: Feliberto Harts M.D.   On: 10/21/2023 16:33    EKG: Independently reviewed.  Atrial fibrillation rate 58, QTc 404.  Assessment/Plan Principal Problem:   AKI (acute kidney injury) (HCC) Active Problems:   SIRS (systemic inflammatory response syndrome) (HCC)   General weakness   Atrial fibrillation (HCC)   HTN (hypertension)  Assessment and Plan: * AKI (acute kidney injury) (HCC) Cr elevated at 3.81, last checked a year ago creatinine was 1.1.  Patient reports recent diagnosis of CKD 3 and subsequently stage II within the past 6 months.  Currently anuric over the past couple of days at least.  Reports barely any oral intake over the past few days.  No vomiting or diarrhea.  CT abdomen and pelvis with normal urinary tract, unfortunately Foley was placed in the vagina. - 4th Liter bolus given, fluid challenge -Continue N/s 75cc/hr x  20hrs -Hold valsartan - In and out cath for now, hold off on Foley  SIRS (systemic inflammatory response syndrome) (HCC) Hypothermic 95.7, leukocytosis of 12.5.  With evidence of endorgan dysfunction, lactic acidosis of 2.3 >> 1.4 and AKI on CKD.  Treated for UTI 2 weeks ago with amoxicillin.  No prior urine cultures on file.  TSH mildly elevated at 6.25 in the setting of acute illness -doubt this is severe enough to cause hypothermia. -Warming blanket -UA still pending -Antibiotics given, urine cultures ordered -Follow-up blood cultures -Chest x-ray with edema or infection, subsequent CT chest without contrast dependent atelectasis in lower lobes -Check procalcitonin -Addendum UA not suggestive of UTI, hence no focus of infection identified at this time.  Procalcitonin elevated, will start broad-spectrum antibiotics IV cefepime and  metronidazole, will use IV doxycycline instead of IV vancomycin due to AKI and anuria.  Atrial fibrillation (HCC) Appears to be new diagnosis.  Prior EKGs do not show patient atrial fibrillation.  Heart rate currently 56-71.  A-fib in the setting of hypotension, possible severe sepsis-pending rule out of further workup. CHAD2Vasc score- at least 3 for age, sex and HTN hx. -Hold off on anticoagulation at this time.  General weakness Generalized weakness, patient appears dehydrated, with hypotension, poor oral intake over the past several days.  Question ongoing UTI.  Head CT negative for acute abnormality.  CT chest and abdomen without suggestion for infectious etiology at this time. -TSH  mildly elevated-6.2, in the setting of acute illness   HTN (hypertension) Initial hypotension blood pressure systolic down to 69. -Hold valsartan, Norvasc   DVT prophylaxis: Heparin Code Status: Full code Family Communication: Spouse at bedside Disposition Plan: ~/> 2 days Consults called: None  Admission status: InpT Stepdown I certify that at the point of admission it is my clinical judgment that the patient will require inpatient hospital care spanning beyond 2 midnights from the point of admission due to high intensity of service, high risk for further deterioration and high frequency of surveillance required.  Author: Onnie Boer, MD 10/21/2023 10:38 PM  For on call review [email or web address].

## 2023-10-21 NOTE — Sepsis Progress Note (Signed)
Sepsis protocol is being followed by [facility]. 

## 2023-10-22 ENCOUNTER — Inpatient Hospital Stay (HOSPITAL_COMMUNITY): Payer: Medicare HMO

## 2023-10-22 DIAGNOSIS — A419 Sepsis, unspecified organism: Principal | ICD-10-CM

## 2023-10-22 DIAGNOSIS — E861 Hypovolemia: Secondary | ICD-10-CM

## 2023-10-22 DIAGNOSIS — N179 Acute kidney failure, unspecified: Secondary | ICD-10-CM | POA: Diagnosis not present

## 2023-10-22 DIAGNOSIS — R0789 Other chest pain: Secondary | ICD-10-CM

## 2023-10-22 DIAGNOSIS — I4891 Unspecified atrial fibrillation: Secondary | ICD-10-CM | POA: Diagnosis not present

## 2023-10-22 DIAGNOSIS — R6510 Systemic inflammatory response syndrome (SIRS) of non-infectious origin without acute organ dysfunction: Secondary | ICD-10-CM | POA: Diagnosis not present

## 2023-10-22 DIAGNOSIS — R6520 Severe sepsis without septic shock: Secondary | ICD-10-CM

## 2023-10-22 LAB — RESPIRATORY PANEL BY PCR

## 2023-10-22 LAB — FOLATE: Folate: 7.8 ng/mL (ref >5.9)

## 2023-10-22 LAB — BASIC METABOLIC PANEL
Anion gap: 10 (ref 5–15)
BUN: 73 mg/dL — ABNORMAL HIGH (ref 8–23)
CO2: 18 mmol/L — ABNORMAL LOW (ref 22–32)
Calcium: 7.8 mg/dL — ABNORMAL LOW (ref 8.9–10.3)
Chloride: 108 mmol/L (ref 98–111)
Creatinine, Ser: 2.78 mg/dL — ABNORMAL HIGH (ref 0.44–1.00)
GFR, Estimated: 17 mL/min — ABNORMAL LOW (ref >60)
Glucose, Bld: 147 mg/dL — ABNORMAL HIGH (ref 70–99)
Potassium: 3.5 mmol/L (ref 3.5–5.1)
Sodium: 136 mmol/L (ref 135–145)

## 2023-10-22 LAB — ECHOCARDIOGRAM COMPLETE
Area-P 1/2: 4.6 cm2
Height: 64 in
S' Lateral: 2.6 cm
Weight: 3241.64 [oz_av]

## 2023-10-22 LAB — CBC
HCT: 31.2 % — ABNORMAL LOW (ref 36.0–46.0)
Hemoglobin: 10.3 g/dL — ABNORMAL LOW (ref 12.0–15.0)
MCH: 29.4 pg (ref 26.0–34.0)
MCHC: 33 g/dL (ref 30.0–36.0)
MCV: 89.1 fL (ref 80.0–100.0)
Platelets: 89 10*3/uL — ABNORMAL LOW (ref 150–400)
RBC: 3.5 MIL/uL — ABNORMAL LOW (ref 3.87–5.11)
RDW: 14.8 % (ref 11.5–15.5)
WBC: 6.7 10*3/uL (ref 4.0–10.5)
nRBC: 0 % (ref 0.0–0.2)

## 2023-10-22 LAB — HEMOGLOBIN A1C
Hgb A1c MFr Bld: 6.2 % — ABNORMAL HIGH (ref 4.8–5.6)
Mean Plasma Glucose: 131.24 mg/dL

## 2023-10-22 LAB — VITAMIN B12: Vitamin B-12: 257 pg/mL (ref 180–914)

## 2023-10-22 LAB — STREP PNEUMONIAE URINARY ANTIGEN: Strep Pneumo Urinary Antigen: NEGATIVE

## 2023-10-22 LAB — PROTIME-INR
INR: 1.1 (ref 0.8–1.2)
Prothrombin Time: 14.5 s (ref 11.4–15.2)

## 2023-10-22 LAB — APTT: aPTT: 30 s (ref 24–36)

## 2023-10-22 LAB — T4, FREE: Free T4: 1.12 ng/dL (ref 0.61–1.12)

## 2023-10-22 MED ORDER — POTASSIUM CHLORIDE IN NACL 20-0.9 MEQ/L-% IV SOLN: INTRAVENOUS | Status: AC

## 2023-10-22 MED ORDER — PERFLUTREN LIPID MICROSPHERE
1.0000 mL | INTRAVENOUS | Status: AC | PRN
  Administered 2023-10-22: 3 mL via INTRAVENOUS

## 2023-10-22 MED ORDER — MELATONIN 3 MG PO TABS
6.0000 mg | ORAL_TABLET | Freq: Once | ORAL | Status: AC
  Administered 2023-10-22: 6 mg via ORAL
  Filled 2023-10-22: qty 2

## 2023-10-22 NOTE — Plan of Care (Signed)

## 2023-10-22 NOTE — Progress Notes (Signed)
*  PRELIMINARY RESULTS* Echocardiogram 2D Echocardiogram has been performed with [facility].  Stacey Drain 10/22/2023, 9:55 AM

## 2023-10-22 NOTE — Consult Note (Signed)
Cardiology Consultation   Patient ID: Christine Gutierrez MRN: [id number]; DOB: May 20, 1951  Admit date: 10/21/2023 Date of Consult: 10/22/2023  PCP:  Christine Pont, DO   [facility] Providers Cardiologist:  None        Patient Profile:   Christine Gutierrez is a 72 y.o. female with a hx of HTN, HLD, CKD who is being seen 10/22/2023 for the evaluation of Afib  at the request of Dr. Arbutus Gutierrez.  History of Present Illness:   Christine Gutierrez with above PMHx. She was treated for a UTI 2 weeks ago.she then developed generalized body pain and was laying on the floor for 3 days and in bed for 2-3 days. On arrival she was in Afib with CVR. She says she had rheumatic fever as a child and was on a lot of antibiotics. Complaining of epigastric pain and chest pressure after eating this am. Hadn't eaten in several days. It hurts to move or turn, full body pain.   Past Medical History:  Diagnosis Date   Hypertension     Past Surgical History:  Procedure Laterality Date   RECTO-VAGINAL FISSURE REPAIR       Home Medications:  Prior to Admission medications   Medication Sig Start Date End Date Taking? Authorizing Provider  amLODipine (NORVASC) 2.5 MG tablet Take 1 tablet by mouth daily.   Yes [provider]  amLODipine (NORVASC) 5 MG tablet Take 1 tablet by mouth daily. 12/21/22  Yes [provider]  amoxicillin (AMOXIL) 875 MG tablet Take 875 mg by mouth 2 (two) times daily. For 7 days 10/08/23  Yes [provider]  diclofenac (VOLTAREN) 75 MG EC tablet Take 75 mg by mouth daily as needed for mild pain (pain score 1-3). 10/23/17  Yes [provider]  levothyroxine (SYNTHROID, LEVOTHROID) 25 MCG tablet Take 25 mcg by mouth daily before breakfast.   Yes [provider]  valsartan (DIOVAN) 160 MG tablet Take 160 mg by mouth daily. 05/23/22  Yes [provider]    Inpatient Medications: Scheduled Meds:  Chlorhexidine Gluconate  Cloth  6 each Topical Daily   heparin  5,000 Units Subcutaneous Q8H   levothyroxine  25 mcg Oral QAC breakfast   Continuous Infusions:  sodium chloride 75 mL/hr at 10/21/23 2303   0.9 % NaCl with KCl 20 mEq / L     ceFEPime (MAXIPIME) IV 2 g (10/21/23 2305)   doxycycline (VIBRAMYCIN) IV 100 mg (10/21/23 2346)   metronidazole 500 mg (10/22/23 0236)   PRN Meds: acetaminophen **OR** acetaminophen, ipratropium-albuterol, ondansetron **OR** ondansetron (ZOFRAN) IV, polyethylene glycol  Allergies:    Allergies  Allergen Reactions   Nitrofurantoin Nausea Only   Sulfa Antibiotics Anaphylaxis    Pt doesn't believe she is allergic to this   Statins Other (See Comments)    Social History:   Social History   Socioeconomic History   Marital status: Married    Spouse name: Not on file   Number of children: Not on file   Years of education: Not on file   Highest education level: Not on file  Occupational History   Not on file  Tobacco Use   Smoking status: Passive Smoke Exposure - Never Smoker   Smokeless tobacco: Never  Vaping Use   Vaping status: Never Used  Substance and Sexual Activity   Alcohol use: No   Drug use: No   Sexual activity: Not on file  Other Topics Concern   Not on file  Social  History Narrative   Not on file   Social Determinants of Health   Financial Resource Strain: Not on file  Food Insecurity: Not on file  Transportation Needs: Not on file  Physical Activity: Not on file  Stress: Not on file  Social Connections: Not on file  Intimate Partner Violence: Not on file    Family History:    History reviewed. No pertinent family history.   ROS:  Please see the history of present illness.  Review of Systems  Constitutional: Positive for decreased appetite and malaise/fatigue.  Cardiovascular:  Positive for chest pain.  Musculoskeletal:  Positive for back pain, joint pain, muscle cramps, muscle weakness, myalgias and stiffness.  Gastrointestinal:   Positive for abdominal pain.    All other ROS reviewed and negative.     Physical Exam/Data:   Vitals:   10/22/23 0500 10/22/23 0700 10/22/23 0730 10/22/23 0800  BP:  (!) 111/41  (!) 142/47  Pulse:  72  86  Resp:  (!) 21  (!) 26  Temp:      TempSrc:      SpO2:  96% 98% 97%  Weight: 91.9 kg     Height:        Intake/Output Summary (Last 24 hours) at 10/22/2023 0900 Last data filed at 10/22/2023 0400 Gross per 24 hour  Intake 878.78 ml  Output 60 ml  Net 818.78 ml      10/22/2023    5:00 AM 10/21/2023    3:20 PM 07/14/2022    1:52 PM  Last 3 Weights  Weight (lbs) 202 lb 9.6 oz 194 lb 0.1 oz 194 lb  Weight (kg) 91.9 kg 88 kg 87.998 kg     Body mass index is 34.78 kg/m.  General:  Well nourished, well developed, in no acute distress  HEENT: normal Neck: no JVD Vascular: No carotid bruits; Distal pulses 2+ bilaterally Cardiac:  normal S1, S2; irreg; no murmur   Lungs:  clear to auscultation bilaterally, no wheezing, rhonchi or rales  Abd: soft, nontender, no hepatomegaly  Ext: no edema Musculoskeletal:  No deformities, BUE and BLE strength normal and equal Skin: warm and dry  Neuro:  CNs 2-12 intact, no focal abnormalities noted Psych:  Normal affect   EKG:  The EKG was personally reviewed and demonstrates:  Afib with nonspecific ST changes Telemetry:  Telemetry was personally reviewed and demonstrates:  Afib with slow VR and some pauses through the night  Relevant CV Studies:    Laboratory Data:  High Sensitivity Troponin:   Recent Labs  Lab 10/21/23 1544  TROPONINIHS 46*     Chemistry Recent Labs  Lab 10/21/23 1544 10/22/23 0426  NA 132* 136  K 3.9 3.5  CL 98 108  CO2 17* 18*  GLUCOSE 202* 147*  BUN 74* 73*  CREATININE 3.81* 2.78*  CALCIUM 9.1 7.8*  GFRNONAA 12* 17*  ANIONGAP 17* 10    Recent Labs  Lab 10/21/23 1544  PROT 7.2  ALBUMIN 2.9*  AST 54*  ALT 30  ALKPHOS 111  BILITOT 0.9   Lipids No results for input(s): "CHOL", "TRIG",  "HDL", "LABVLDL", "LDLCALC", "CHOLHDL" in the last 168 hours.  Hematology Recent Labs  Lab 10/21/23 1544 10/22/23 0426  WBC 12.4* 6.7  RBC 4.62 3.50*  HGB 13.3 10.3*  HCT 40.2 31.2*  MCV 87.0 89.1  MCH 28.8 29.4  MCHC 33.1 33.0  RDW 14.5 14.8  PLT 105* 89*   Thyroid  Recent Labs  Lab 10/21/23 1544  TSH  6.254*    BNP Recent Labs  Lab 10/21/23 1547  BNP 108.0*    DDimer No results for input(s): "DDIMER" in the last 168 hours.   Radiology/Studies:  CT CHEST ABDOMEN PELVIS WO CONTRAST  Result Date: 10/21/2023 CLINICAL DATA:  Sepsis SIRS, AKI on CKD, Anuic. EXAM: CT CHEST, ABDOMEN AND PELVIS WITHOUT CONTRAST TECHNIQUE: Multidetector CT imaging of the chest, abdomen and pelvis was performed following the standard protocol without IV contrast. RADIATION DOSE REDUCTION: This exam was performed according to the departmental dose-optimization program which includes automated exposure control, adjustment of the mA and/or kV according to patient size and/or use of iterative reconstruction technique. COMPARISON:  None Available. FINDINGS: CT CHEST FINDINGS Cardiovascular: Heart is normal size. Aorta is normal caliber. Coronary artery and aortic atherosclerosis. Mediastinum/Nodes: No mediastinal, hilar, or axillary adenopathy. Trachea and esophagus are unremarkable. Thyroid unremarkable. Lungs/Pleura: Minimal dependent atelectasis in the lower lobes. No confluent opacities or effusions. Musculoskeletal: Chest wall soft tissues are unremarkable. Mild compression through the superior endplate of T11, age indeterminate. CT ABDOMEN PELVIS FINDINGS Hepatobiliary: No focal hepatic abnormality. Gallbladder unremarkable. Pancreas: No focal abnormality or ductal dilatation. Spleen: No focal abnormality.  Normal size. Adrenals/Urinary Tract: No adrenal abnormality. No focal renal abnormality. No stones or hydronephrosis. Urinary bladder is unremarkable. Foley catheter is within the vagina rather than the  urinary bladder. Stomach/Bowel: Diffuse colonic diverticulosis. No active diverticulitis. Stomach and small bowel decompressed. No bowel obstruction. Vascular/Lymphatic: Aortic atherosclerosis. No evidence of aneurysm or adenopathy. Reproductive: Uterus and adnexa unremarkable. No mass. Again, Foley catheter noted within the vagina. Other: No free fluid or free air. Musculoskeletal: No acute bony abnormality. IMPRESSION: Dependent atelectasis in the lower lobes. Coronary artery disease, aortic atherosclerosis. No acute cardiopulmonary disease. Diffuse colonic diverticulosis.  No active diverticulitis. Foley catheter balloon noted within the vagina rather than the urinary bladder. No acute findings in the abdomen or pelvis. These results will be called to the ordering clinician or representative by the Radiologist Assistant, and communication documented in the PACS or [employer]. Electronically Signed   By: Charlett Nose M.D.   On: 10/21/2023 19:23   DG Chest Port 1 View  Result Date: 10/21/2023 CLINICAL DATA:  Sepsis EXAM: PORTABLE CHEST 1 VIEW COMPARISON:  None Available. FINDINGS: Heart size is mildly enlarged. Aortic atherosclerosis. Mild diffuse interstitial prominence. No appreciable pleural fluid collection. No pneumothorax. IMPRESSION: Mild diffuse interstitial prominence, which may reflect edema or atypical/viral infection. Electronically Signed   By: Duanne Guess D.O.   On: 10/21/2023 16:46   DG Pelvis Portable  Result Date: 10/21/2023 CLINICAL DATA:  Fever chills and nausea. EXAM: PORTABLE PELVIS 1-2 VIEWS COMPARISON:  None Available. FINDINGS: There is no evidence of pelvic fracture or diastasis. No pelvic bone lesions are seen. Normal bowel gas pattern. IMPRESSION: Negative. Electronically Signed   By: Ted Mcalpine M.D.   On: 10/21/2023 16:39   CT Head Wo Contrast  Result Date: 10/21/2023 CLINICAL DATA:  Polytrauma, blunt EXAM: CT HEAD WITHOUT CONTRAST TECHNIQUE: Contiguous  axial images were obtained from the base of the skull through the vertex without intravenous contrast. RADIATION DOSE REDUCTION: This exam was performed according to the departmental dose-optimization program which includes automated exposure control, adjustment of the mA and/or kV according to patient size and/or use of iterative reconstruction technique. COMPARISON:  MRI head August 11, 23. FINDINGS: Brain: Patchy white matter no evidence of acute infarction, hemorrhage, hydrocephalus, extra-axial collection or mass lesion/mass effect. Hypodensities are nonspecific but compatible with chronic microvascular ischemic  change. Vascular: Calcific atherosclerosis. Skull: No acute fracture. Sinuses/Orbits: Clear sinuses.  No acute findings. Other: No mastoid effusions. IMPRESSION: No evidence of acute intracranial abnormality. Electronically Signed   By: Feliberto Harts M.D.   On: 10/21/2023 16:33     Assessment and Plan:   Afib rate controlled in setting of severe sepsis, slow rates with pauses through the night. Suspected sleep apnea, will need OP sleep study currently on heparin SQ, consider IV heparin with AKI. Await echo.  History of rheumatic fever as a child. Await echo.  AKI Crt 3.81>2.78  SIRS per primary team  Sepsis per primary team  HTN   Risk Assessment/Risk Scores:          CHA2DS2-VASc Score = 3   This indicates a 3.2% annual risk of stroke. The patient's score is based upon: CHF History: 0 HTN History: 1 Diabetes History: 0 Stroke History: 0 Vascular Disease History: 0 Age Score: 1 Gender Score: 1         For questions or updates, please contact [facility] Please consult [email or web address] for contact info under    Signed, Jacolyn Reedy, PA-C  10/22/2023 9:00 AM

## 2023-10-22 NOTE — Hospital Course (Addendum)
72 year old female with history of hypertension, hyperlipidemia, CKD, hypothyroidism presenting with generalized weakness for about 2 weeks.  The patient states that she went to her PCP and was started on amoxicillin 875 mg twice daily with some improvement of her urinary symptoms.  Reviewed the medical records shows that the patient received an injection of ceftriaxone on 10/03/2023 and was started on amoxicillin at that time.  However, the patient continued to have worsening generalized weakness with fevers and chills.  She stated that she had a fever up to 101.0 F on 10/20/2023.  She also endorsed having some shortness of breath and coughing.  She denies any hemoptysis.  She denies any vomiting, diarrhea.  She complains of some upper abdominal discomfort.  She denies any hematochezia or melena.  She denies any rashes or headache or neck pain. Apparently, the patient fell out of bed about 5 days prior to admission.  She states she decided to lay on the floor for the next 3 days.  She reported that any type by her family her husband to get up would be unsuccessful because the patient which is screaming pain all over.  The family was subsequently able to get up into her bed where she remained for the next 2 days.  She continued to have poor oral intake and fevers and chills.  She did complain of some nausea without any emesis.  She complained of diffuse abdominal pain.  Subsequently, her family brought her to the emergency department by private vehicle. In the ED, the patient was afebrile.  She was hypotensive with blood pressure 69/51.  The patient was given the appropriate fluid boluses per sepsis protocol.  She was empirically started on vancomycin and cefepime and subsequently maintenance IV fluids. Sodium 132, potassium 3.9, bicarbonate 17, BUN 74, creatinine 3.81.  WBC 12.4, hemoglobin 13.3, platelets 105.  Lactic acid 2.3>> 1.4 PCT 2.36.  CPK 207. ABG 7.3 05/02/71/16.  BNP 108

## 2023-10-22 NOTE — TOC Initial Note (Signed)
Transition of Care [facility]) - Initial/Assessment Note    Patient Details  Name: Christine Gutierrez MRN: [id number] Date of Birth: 05/05/1951  Transition of Care [facility]) CM/SW Contact:    Elliot Gault, LCSW Phone Number: 10/22/2023, 4:08 PM  Clinical Narrative:                  Pt admitted from home. PT recommending HHPT at dc.   Met with pt at bedside today to assess. Per pt, she resides with her husband and their two adult sons and their 52 dogs. Pt reports that she is able to get to appointments and obtain medications as needed.  Discussed HHPT recommendation and pt states due to the amount of pets she has, she does not want to have HH at dc. Pt aware that if she changes her mind, TOC can follow up to arrange.  Will follow.  Expected Discharge Plan: Home/Self Care Barriers to Discharge: Continued Medical Work up   Patient Goals and CMS Choice Patient states their goals for this hospitalization and ongoing recovery are:: go home          Expected Discharge Plan and Services In-house Referral: Clinical Social Work     Living arrangements for the past 2 months: Single Family Home                           HH Arranged: Patient Refused HH          Prior Living Arrangements/Services Living arrangements for the past 2 months: Single Family Home Lives with:: Adult Children, Spouse Patient language and need for interpreter reviewed:: Yes Do you feel safe going back to the place where you live?: Yes      Need for Family Participation in Patient Care: No (Comment) Care giver support system in place?: Yes (comment) Current home services: DME Criminal Activity/Legal Involvement Pertinent to Current Situation/Hospitalization: No - Comment as needed  Activities of Daily Living      Permission Sought/Granted                  Emotional Assessment Appearance:: Appears stated age Attitude/Demeanor/Rapport: Engaged Affect (typically observed):  Pleasant Orientation: : Oriented to Self, Oriented to Place, Oriented to  Time, Oriented to Situation Alcohol / Substance Use: Not Applicable Psych Involvement: No (comment)  Admission diagnosis:  Anuria [R34] AKI (acute kidney injury) (HCC) [N17.9] Hypotension, unspecified hypotension type [I95.9] Acute renal failure, unspecified acute renal failure type (HCC) [N17.9] Leukocytosis, unspecified type [D72.829] Patient Active Problem List   Diagnosis Date Noted   Sepsis due to undetermined organism (HCC) 10/22/2023   Hypovolemia dehydration 10/22/2023   Non-cardiac chest pain 10/22/2023   AKI (acute kidney injury) (HCC) 10/21/2023   HTN (hypertension) 10/21/2023   SIRS due to infectious process with organ dysfunction (HCC) 10/21/2023   General weakness 10/21/2023   Atrial fibrillation (HCC) 10/21/2023   PCP:  Lorelei Pont, DO Pharmacy:   [facility] [address] [place] - [address] [place] [address] Phone: [phone] Fax: [phone]  [id number] - [place], [place] - [address] [facility] [address] [place] [address] Phone: [phone] Fax: [phone]     Social Determinants of Health (SDOH) Social History: SDOH Screenings   Tobacco Use: Medium Risk (10/21/2023)   SDOH Interventions:     Readmission Risk Interventions     No data to display

## 2023-10-22 NOTE — Progress Notes (Addendum)
PROGRESS NOTE  Senora Ketchum XWR:[id number] DOB: October 27, 1951 DOA: 10/21/2023 PCP: Lorelei Pont, DO  Brief History:  72 year old female with history of hypertension, hyperlipidemia, CKD, hypothyroidism presenting with generalized weakness for about 2 weeks.  The patient states that she went to her PCP and was started on amoxicillin 875 mg twice daily with some improvement of her urinary symptoms.  Reviewed the medical records shows that the patient received an injection of ceftriaxone on 10/03/2023 and was started on amoxicillin at that time.  However, the patient continued to have worsening generalized weakness with fevers and chills.  She stated that she had a fever up to 101.0 F on 10/20/2023.  She also endorsed having some shortness of breath and coughing.  She denies any hemoptysis.  She denies any vomiting, diarrhea.  She complains of some upper abdominal discomfort.  She denies any hematochezia or melena.  She denies any rashes or headache or neck pain. Apparently, the patient fell out of bed about 5 days prior to admission.  She states she decided to lay on the floor for the next 3 days.  She reported that any type by her family her husband to get up would be unsuccessful because the patient which is screaming pain all over.  The family was subsequently able to get up into her bed where she remained for the next 2 days.  She continued to have poor oral intake and fevers and chills.  She did complain of some nausea without any emesis.  She complained of diffuse abdominal pain.  Subsequently, her family brought her to the emergency department by private vehicle. In the ED, the patient was afebrile.  She was hypotensive with blood pressure 69/51.  The patient was given the appropriate fluid boluses per sepsis protocol.  She was empirically started on vancomycin and cefepime and subsequently maintenance IV fluids. Sodium 132, potassium 3.9, bicarbonate 17, BUN 74, creatinine 3.81.   WBC 12.4, hemoglobin 13.3, platelets 105.  Lactic acid 2.3>> 1.4 PCT 2.36.  CPK 207. ABG 7.3 05/02/71/16.  BNP 108   Assessment/Plan: SIRS -Patient presented with leukocytosis and hypotension and hypothermia 97.5 F -Lactic acid 2.3>> 1.4 -PCT 2.36 -UA 6-10 WBC -Personally reviewed chest x-ray bilateral interstitial opacities -Continue IV fluids -Continue doxycycline and cefepime -Suspect patient has atypical pneumonia (mycoplasma vs chlamydia vs legionella) -COVID-19 PCR negative -Check viral respiratory panel -10/21/2023 CT abdomen and pelvis--age-indeterminate T11 compression fracture; no bowel wall thickening or obstruction  Acute respiratory failure with hypoxia -Oxygen saturation 89% on room air with tachypnea -Suspect atypical pneumonia -Stable on 2 3 L -Urinary Legionella antigen -Lower respiratory panel -Continue cefepime and doxycycline -MRSA screen negative  AKI -Baseline creatinine 0.8-1.1 -Presented with serum creatinine 3.1 -Renal ultrasound  New onset atrial fibrillation -Personally reviewed EKG--atrial fibrillation, no concerning ST-T wave change -Echocardiogram -Cardiology consult  Generalized weakness -Secondary to infectious process -B12 -Folic acid -PT evaluation -CT brain negative for acute findings  Essential hypertension -Initially hypotensive -Holding amlodipine and valsartan  Hyperglycemia -Check A1c  Thrombocytopenia -due to sepsis -B12 -folate -[place] abd -check coags      Family Communication: no  Family at bedside  Consultants:  cardiology  Code Status:  FULL  DVT Prophylaxis:  [place] Heparin   Procedures: As Listed in Progress Note Above  Antibiotics: Cefepime 11/17>> Doxy 11/17>>     Subjective: Patient continues to complain of coughing and upper abdominal pain.  She denies any vomiting.  She denies  any hematochezia, melena, headache, neck pain.  There is no dysuria.  Objective: Vitals:   10/22/23 0500 10/22/23  0700 10/22/23 0730 10/22/23 0800  BP:  (!) 111/41  (!) 142/47  Pulse:  72  86  Resp:  (!) 21  (!) 26  Temp:      TempSrc:      SpO2:  96% 98% 97%  Weight: 91.9 kg     Height:        Intake/Output Summary (Last 24 hours) at 10/22/2023 0846 Last data filed at 10/22/2023 0400 Gross per 24 hour  Intake 878.78 ml  Output 60 ml  Net 818.78 ml   Weight change:  Exam:  General:  Pt is alert, follows commands appropriately, not in acute distress HEENT: No icterus, No thrush, No neck mass, [place]/AT Cardiovascular: IRRR, S1/S2, no rubs, no gallops Respiratory: Bilateral rales.  Bilateral expiratory wheeze. Abdomen: Soft/+BS, non tender, non distended, no guarding Extremities: No edema, No lymphangitis, No petechiae, No rashes, no synovitis   Data Reviewed: I have personally reviewed following labs and imaging studies Basic Metabolic Panel: Recent Labs  Lab 10/21/23 1544 10/22/23 0426  NA 132* 136  K 3.9 3.5  CL 98 108  CO2 17* 18*  GLUCOSE 202* 147*  BUN 74* 73*  CREATININE 3.81* 2.78*  CALCIUM 9.1 7.8*   Liver Function Tests: Recent Labs  Lab 10/21/23 1544  AST 54*  ALT 30  ALKPHOS 111  BILITOT 0.9  PROT 7.2  ALBUMIN 2.9*   No results for input(s): "LIPASE", "AMYLASE" in the last 168 hours. No results for input(s): "AMMONIA" in the last 168 hours. Coagulation Profile: Recent Labs  Lab 10/21/23 1544  INR 1.1   CBC: Recent Labs  Lab 10/21/23 1544 10/22/23 0426  WBC 12.4* 6.7  NEUTROABS 7.5  --   HGB 13.3 10.3*  HCT 40.2 31.2*  MCV 87.0 89.1  PLT 105* 89*   Cardiac Enzymes: Recent Labs  Lab 10/21/23 1544  CKTOTAL 207   BNP: Invalid input(s): "POCBNP" CBG: Recent Labs  Lab 10/21/23 1523  GLUCAP 177*   HbA1C: No results for input(s): "HGBA1C" in the last 72 hours. Urine analysis:    Component Value Date/Time   COLORURINE AMBER (A) 10/21/2023 2000   APPEARANCEUR CLOUDY (A) 10/21/2023 2000   LABSPEC 1.016 10/21/2023 2000   PHURINE 5.0  10/21/2023 2000   GLUCOSEU NEGATIVE 10/21/2023 2000   HGBUR SMALL (A) 10/21/2023 2000   BILIRUBINUR NEGATIVE 10/21/2023 2000   KETONESUR NEGATIVE 10/21/2023 2000   PROTEINUR 30 (A) 10/21/2023 2000   NITRITE NEGATIVE 10/21/2023 2000   LEUKOCYTESUR NEGATIVE 10/21/2023 2000   Sepsis Labs: @LABRCNTIP (procalcitonin:4,lacticidven:4) ) Recent Results (from the past 240 hour(s))  Blood Culture (routine x 2)     Status: None (Preliminary result)   Collection Time: 10/21/23  3:44 PM   Specimen: Right Antecubital; Blood  Result Value Ref Range Status   Specimen Description   Final    RIGHT ANTECUBITAL BOTTLES DRAWN AEROBIC AND ANAEROBIC   Special Requests Blood Culture adequate volume  Final   Culture   Final    NO GROWTH < 24 HOURS Performed at [facility], [address], [place], [place] [address]    Report Status PENDING  Incomplete  Blood Culture (routine x 2)     Status: None (Preliminary result)   Collection Time: 10/21/23  3:44 PM   Specimen: BLOOD LEFT HAND  Result Value Ref Range Status   Specimen Description   Final  BLOOD LEFT HAND BOTTLES DRAWN AEROBIC AND ANAEROBIC   Special Requests Blood Culture adequate volume  Final   Culture   Final    NO GROWTH < 24 HOURS Performed at [facility], [address] [place], [place] [address]    Report Status PENDING  Incomplete  Resp panel by RT-PCR (RSV, Flu A&B, Covid) Anterior Nasal Swab     Status: None   Collection Time: 10/21/23  4:06 PM   Specimen: Anterior Nasal Swab  Result Value Ref Range Status   SARS Coronavirus 2 by RT PCR NEGATIVE NEGATIVE Final    Comment: (NOTE) SARS-CoV-2 target nucleic acids are NOT DETECTED.  The SARS-CoV-2 RNA is generally detectable in upper respiratory specimens during the acute phase of infection. The lowest concentration of SARS-CoV-2 viral copies this assay can detect is 138 copies/mL. A negative result does not preclude SARS-Cov-2 infection and should not be used as the sole basis  for treatment or other patient management decisions. A negative result may occur with  improper specimen collection/handling, submission of specimen other than nasopharyngeal swab, presence of viral mutation(s) within the areas targeted by this assay, and inadequate number of viral copies(<138 copies/mL). A negative result must be combined with clinical observations, patient history, and epidemiological information. The expected result is Negative.  Fact Sheet for Patients:  BloggerCourse.com  Fact Sheet for Healthcare Providers:  SeriousBroker.it  This test is no t yet approved or cleared by the [place] FDA and  has been authorized for detection and/or diagnosis of SARS-CoV-2 by FDA under an Emergency Use Authorization (EUA). This EUA will remain  in effect (meaning this test can be used) for the duration of the COVID-19 declaration under Section 564(b)(1) of the Act, 21 U.S.C.section 360bbb-3(b)(1), unless the authorization is terminated  or revoked sooner.       Influenza A by PCR NEGATIVE NEGATIVE Final   Influenza B by PCR NEGATIVE NEGATIVE Final    Comment: (NOTE) The Xpert Xpress SARS-CoV-2/FLU/RSV plus assay is intended as an aid in the diagnosis of influenza from Nasopharyngeal swab specimens and should not be used as a sole basis for treatment. Nasal washings and aspirates are unacceptable for Xpert Xpress SARS-CoV-2/FLU/RSV testing.  Fact Sheet for Patients: BloggerCourse.com  Fact Sheet for Healthcare Providers: SeriousBroker.it  This test is not yet approved or cleared by the [place] FDA and has been authorized for detection and/or diagnosis of SARS-CoV-2 by FDA under an Emergency Use Authorization (EUA). This EUA will remain in effect (meaning this test can be used) for the duration of the COVID-19 declaration under Section 564(b)(1) of the Act, 21  U.S.C. section 360bbb-3(b)(1), unless the authorization is terminated or revoked.     Resp Syncytial Virus by PCR NEGATIVE NEGATIVE Final    Comment: (NOTE) Fact Sheet for Patients: BloggerCourse.com  Fact Sheet for Healthcare Providers: SeriousBroker.it  This test is not yet approved or cleared by the [place] FDA and has been authorized for detection and/or diagnosis of SARS-CoV-2 by FDA under an Emergency Use Authorization (EUA). This EUA will remain in effect (meaning this test can be used) for the duration of the COVID-19 declaration under Section 564(b)(1) of the Act, 21 U.S.C. section 360bbb-3(b)(1), unless the authorization is terminated or revoked.  Performed at [facility], [address] [place], [place] [address]   MRSA Next Gen by PCR, Nasal     Status: None   Collection Time: 10/21/23  6:22 PM   Specimen: Nasal Mucosa; Nasal Swab  Result  Value Ref Range Status   MRSA by PCR Next Gen NOT DETECTED NOT DETECTED Final    Comment: (NOTE) The GeneXpert MRSA Assay (FDA approved for NASAL specimens only), is one component of a comprehensive MRSA colonization surveillance program. It is not intended to diagnose MRSA infection nor to guide or monitor treatment for MRSA infections. Test performance is not FDA approved in patients less than 27 years old. Performed at [facility], [address], [place], [place] [address]      Scheduled Meds:  Chlorhexidine Gluconate Cloth  6 each Topical Daily   heparin  5,000 Units Subcutaneous Q8H   levothyroxine  25 mcg Oral QAC breakfast   Continuous Infusions:  sodium chloride 75 mL/hr at 10/21/23 2303   ceFEPime (MAXIPIME) IV 2 g (10/21/23 2305)   doxycycline (VIBRAMYCIN) IV 100 mg (10/21/23 2346)   metronidazole 500 mg (10/22/23 0236)    Procedures/Studies: CT CHEST ABDOMEN PELVIS WO CONTRAST  Result Date: 10/21/2023 CLINICAL DATA:  Sepsis SIRS, AKI on CKD, Anuic.  EXAM: CT CHEST, ABDOMEN AND PELVIS WITHOUT CONTRAST TECHNIQUE: Multidetector CT imaging of the chest, abdomen and pelvis was performed following the standard protocol without IV contrast. RADIATION DOSE REDUCTION: This exam was performed according to the departmental dose-optimization program which includes automated exposure control, adjustment of the mA and/or kV according to patient size and/or use of iterative reconstruction technique. COMPARISON:  None Available. FINDINGS: CT CHEST FINDINGS Cardiovascular: Heart is normal size. Aorta is normal caliber. Coronary artery and aortic atherosclerosis. Mediastinum/Nodes: No mediastinal, hilar, or axillary adenopathy. Trachea and esophagus are unremarkable. Thyroid unremarkable. Lungs/Pleura: Minimal dependent atelectasis in the lower lobes. No confluent opacities or effusions. Musculoskeletal: Chest wall soft tissues are unremarkable. Mild compression through the superior endplate of T11, age indeterminate. CT ABDOMEN PELVIS FINDINGS Hepatobiliary: No focal hepatic abnormality. Gallbladder unremarkable. Pancreas: No focal abnormality or ductal dilatation. Spleen: No focal abnormality.  Normal size. Adrenals/Urinary Tract: No adrenal abnormality. No focal renal abnormality. No stones or hydronephrosis. Urinary bladder is unremarkable. Foley catheter is within the vagina rather than the urinary bladder. Stomach/Bowel: Diffuse colonic diverticulosis. No active diverticulitis. Stomach and small bowel decompressed. No bowel obstruction. Vascular/Lymphatic: Aortic atherosclerosis. No evidence of aneurysm or adenopathy. Reproductive: Uterus and adnexa unremarkable. No mass. Again, Foley catheter noted within the vagina. Other: No free fluid or free air. Musculoskeletal: No acute bony abnormality. IMPRESSION: Dependent atelectasis in the lower lobes. Coronary artery disease, aortic atherosclerosis. No acute cardiopulmonary disease. Diffuse colonic diverticulosis.  No active  diverticulitis. Foley catheter balloon noted within the vagina rather than the urinary bladder. No acute findings in the abdomen or pelvis. These results will be called to the ordering clinician or representative by the Radiologist Assistant, and communication documented in the PACS or [employer]. Electronically Signed   By: Charlett Nose M.D.   On: 10/21/2023 19:23   DG Chest Port 1 View  Result Date: 10/21/2023 CLINICAL DATA:  Sepsis EXAM: PORTABLE CHEST 1 VIEW COMPARISON:  None Available. FINDINGS: Heart size is mildly enlarged. Aortic atherosclerosis. Mild diffuse interstitial prominence. No appreciable pleural fluid collection. No pneumothorax. IMPRESSION: Mild diffuse interstitial prominence, which may reflect edema or atypical/viral infection. Electronically Signed   By: Duanne Guess D.O.   On: 10/21/2023 16:46   DG Pelvis Portable  Result Date: 10/21/2023 CLINICAL DATA:  Fever chills and nausea. EXAM: PORTABLE PELVIS 1-2 VIEWS COMPARISON:  None Available. FINDINGS: There is no evidence of pelvic fracture or diastasis. No pelvic bone lesions are seen. Normal bowel  gas pattern. IMPRESSION: Negative. Electronically Signed   By: Ted Mcalpine M.D.   On: 10/21/2023 16:39   CT Head Wo Contrast  Result Date: 10/21/2023 CLINICAL DATA:  Polytrauma, blunt EXAM: CT HEAD WITHOUT CONTRAST TECHNIQUE: Contiguous axial images were obtained from the base of the skull through the vertex without intravenous contrast. RADIATION DOSE REDUCTION: This exam was performed according to the departmental dose-optimization program which includes automated exposure control, adjustment of the mA and/or kV according to patient size and/or use of iterative reconstruction technique. COMPARISON:  MRI head August 11, 23. FINDINGS: Brain: Patchy white matter no evidence of acute infarction, hemorrhage, hydrocephalus, extra-axial collection or mass lesion/mass effect. Hypodensities are nonspecific but compatible  with chronic microvascular ischemic change. Vascular: Calcific atherosclerosis. Skull: No acute fracture. Sinuses/Orbits: Clear sinuses.  No acute findings. Other: No mastoid effusions. IMPRESSION: No evidence of acute intracranial abnormality. Electronically Signed   By: Feliberto Harts M.D.   On: 10/21/2023 16:33    Catarina Hartshorn, DO  [facility]  If 7PM-7AM, please contact night-coverage [email or web address] Password [email or web address] 10/22/2023, 8:46 AM   LOS: 1 day

## 2023-10-22 NOTE — Plan of Care (Signed)
  Problem: Acute Rehab PT Goals(only PT should resolve) Goal: Pt Will Go Supine/Side To Sit Flowsheets (Taken 10/22/2023 1558) Pt will go Supine/Side to Sit: Independently Goal: Patient Will Transfer Sit To/From Stand Flowsheets (Taken 10/22/2023 1558) Patient will transfer sit to/from stand: Independently Goal: Pt Will Transfer Bed To Chair/Chair To Bed Flowsheets (Taken 10/22/2023 1558) Pt will Transfer Bed to Chair/Chair to Bed: Independently Goal: Pt Will Ambulate Flowsheets (Taken 10/22/2023 1558) Pt will Ambulate:  75 feet  Independently Goal: Pt Will Go Up/Down Stairs Flowsheets (Taken 10/22/2023 1558) Pt will Go Up / Down Stairs:  3-5 stairs  Independently  Christine Gutierrez PT, DPT Physical Therapist with [name] [phone] office

## 2023-10-22 NOTE — Evaluation (Signed)
Physical Therapy Evaluation Patient Details Name: Christine Gutierrez MRN: [id number] DOB: 08/19/1951 Today's Date: 10/22/2023  History of Present Illness  a 72 y.o. female with medical history significant for hypertension, dyslipidemia, CKD.  Patient was brought to the ED with reports of generalized weakness, subjective fevers.  Patient reports a couple of weeks ago, she was diagnosed with urinary tract infection, and was placed on amoxicillin 875 mg twice daily was compliant with and took for 7 days.  She does not know if urine cultures were drawn.  She reports initially she felt better, then subsequently started feeling worse.  She reports subjective fevers.  About 5 days ago, she fell out of bed, she was on the floor for 3 days.  She reports that any attempt by husband to lift her up, she would scream due to generalized body pain.  Family were finally able to get her up, and then she remained in bed for another 2 days.  She reports poor oral intake for the past several days.  She reports she has not made any urine over the past few days.  No vomiting no diarrhea.  She also reports onset of difficulty breathing over the past few days, and a mild cough.    No confused speech.  Clinical Impression   Pt tolerated today's Physical Therapy Evaluation, well. Showing great reutn for mobility and tolerating sitting up in recliner @ EOB. . Pt demonstrating mild functional deficits with limitations in dynamic balance and transfers due to muscle weakness and reduced mobility in setting of medical issues. Based upon these deficits/impairments, patient will benefit from continued skilled physical therapy services during remainder of hospital stay and at the next recommended venue of care to address deficits and promote return to optimal function.                If plan is discharge home, recommend the following: A little help with walking and/or transfers;A little help with bathing/dressing/bathroom    Can travel by private vehicle        Equipment Recommendations None recommended by PT  Recommendations for Other Services       Functional Status Assessment Patient has had a recent decline in their functional status and demonstrates the ability to make significant improvements in function in a reasonable and predictable amount of time.     Precautions / Restrictions Precautions Precautions: Fall Restrictions Weight Bearing Restrictions: No      Mobility  Bed Mobility Overal bed mobility: Needs Assistance Bed Mobility: Supine to Sit     Supine to sit: Contact guard     General bed mobility comments: slow, guarded movement with cues for use of bedrail. Patient Response: Cooperative  Transfers Overall transfer level: Needs assistance Equipment used: None Transfers: Sit to/from Stand, Bed to chair/wheelchair/BSC Sit to Stand: Contact guard assist Stand pivot transfers: Contact guard assist         General transfer comment: CGA for sit/stand from EOB and BSC twice. Cues for use BUE support on armrest. Pt reaching from [facility] to bedrail and had LOB anterior with CGA to maintain upright posture.    Ambulation/Gait Ambulation/Gait assistance: Contact guard assist Gait Distance (Feet): 5 Feet Assistive device: None Gait Pattern/deviations: WFL(Within Functional Limits), Step-through pattern, Decreased step length - right, Decreased step length - left       General Gait Details: slow, guarded ambulation from BSC 66ft toward EOB with no AD.  Stairs  Wheelchair Mobility     Tilt Bed Tilt Bed Patient Response: Cooperative  Modified Rankin (Stroke Patients Only)       Balance Overall balance assessment: Needs assistance Sitting-balance support: No upper extremity supported Sitting balance-Leahy Scale: Good Sitting balance - Comments: good/good sitting balance at EOB and BSC     Standing balance-Leahy Scale: Good Standing balance comment:  fair/good standing balance without AD                             Pertinent Vitals/Pain Pain Assessment Pain Assessment: No/denies pain    Home Living Family/patient expects to be discharged to:: Private residence Living Arrangements: Spouse/significant other;Children Available Help at Discharge: Family Type of Home: House Home Access: Stairs to enter Entrance Stairs-Rails: Left;Can reach Therapist, art of Steps: 6   Home Layout: Two level;Able to live on main level with bedroom/bathroom Home Equipment: None      Prior Function Prior Level of Function : Independent/Modified Independent             Mobility Comments: Pt report independence with all means of mobiltiy and primary driver ADLs Comments: Independent     Extremity/Trunk Assessment   Upper Extremity Assessment Upper Extremity Assessment: Generalized weakness    Lower Extremity Assessment Lower Extremity Assessment: Generalized weakness       Communication   Communication Communication: No apparent difficulties Cueing Techniques: Verbal cues;Tactile cues  Cognition Arousal: Alert Behavior During Therapy: WFL for tasks assessed/performed Overall Cognitive Status: Within Functional Limits for tasks assessed                                          General Comments      Exercises     Assessment/Plan    PT Assessment Patient needs continued PT services  PT Problem List Decreased strength;Decreased activity tolerance;Decreased balance;Decreased mobility       PT Treatment Interventions Gait training;Stair training;Functional mobility training;Therapeutic activities;Therapeutic exercise;Balance training;Neuromuscular re-education    PT Goals (Current goals can be found in the Care Plan section)  Acute Rehab PT Goals Patient Stated Goal: go home PT Goal Formulation: With patient Time For Goal Achievement: 11/05/23 Potential to Achieve Goals:  Good    Frequency Min 2X/week     Co-evaluation               AM-PAC PT "6 Clicks" Mobility  Outcome Measure Help needed turning from your back to your side while in a flat bed without using bedrails?: None Help needed moving from lying on your back to sitting on the side of a flat bed without using bedrails?: None Help needed moving to and from a bed to a chair (including a wheelchair)?: A Little Help needed standing up from a chair using your arms (e.g., wheelchair or bedside chair)?: A Little Help needed to walk in hospital room?: A Little Help needed climbing 3-5 steps with a railing? : A Lot 6 Click Score: 19    End of Session Equipment Utilized During Treatment: Gait belt;Oxygen Activity Tolerance: Patient tolerated treatment well Patient left: in chair;with call bell/phone within reach Nurse Communication: Mobility status PT Visit Diagnosis: Unsteadiness on feet (R26.81);Other abnormalities of gait and mobility (R26.89);Muscle weakness (generalized) (M62.81)    Time: 7829-5621 PT Time Calculation (min) (ACUTE ONLY): 30 min   Charges:   PT Evaluation $PT Eval  Low Complexity: 1 Low PT Treatments $Therapeutic Activity: 8-22 mins PT General Charges $$ ACUTE PT VISIT: 1 Visit         Nelida Meuse PT, DPT Physical Therapist with [name] [phone] office  Nelida Meuse 10/22/2023, 3:56 PM

## 2023-10-22 NOTE — Progress Notes (Signed)
Pharmacy Antibiotic Note  Christine Gutierrez is a 72 y.o. female admitted on 10/21/2023 with sepsis.  Pharmacy has been consulted for Cefepime dosing. WBC is elevated. Noted renal dysfunction.  Plan: Cefepime 2g IV q24h Doxy/Flagyl per MD Trend WBC, temp, renal function  Height: 5\' 4"  (162.6 cm) Weight: 88 kg (194 lb 0.1 oz) IBW/kg (Calculated) : 54.7  Temp (24hrs), Avg:96.9 F (36.1 C), Min:95.7 F (35.4 C), Max:98.2 F (36.8 C)  Recent Labs  Lab 10/21/23 1544 10/21/23 1722  WBC 12.4*  --   CREATININE 3.81*  --   LATICACIDVEN 2.3* 1.4    Estimated Creatinine Clearance: 14.1 mL/min (A) (by C-G formula based on SCr of 3.81 mg/dL (H)).    Allergies  Allergen Reactions   Nitrofurantoin Nausea Only   Sulfa Antibiotics Anaphylaxis    Pt doesn't believe she is allergic to this   Statins Other (See Comments)    Abran Duke, PharmD, BCPS Clinical Pharmacist Phone: [phone]

## 2023-10-22 NOTE — Plan of Care (Signed)

## 2023-10-23 ENCOUNTER — Inpatient Hospital Stay (HOSPITAL_COMMUNITY): Payer: Medicare HMO

## 2023-10-23 ENCOUNTER — Other Ambulatory Visit (HOSPITAL_COMMUNITY): Payer: Self-pay

## 2023-10-23 ENCOUNTER — Telehealth (HOSPITAL_COMMUNITY): Payer: Self-pay | Admitting: Pharmacy Technician

## 2023-10-23 DIAGNOSIS — A419 Sepsis, unspecified organism: Secondary | ICD-10-CM | POA: Diagnosis not present

## 2023-10-23 DIAGNOSIS — N179 Acute kidney failure, unspecified: Secondary | ICD-10-CM | POA: Diagnosis not present

## 2023-10-23 DIAGNOSIS — I480 Paroxysmal atrial fibrillation: Secondary | ICD-10-CM | POA: Diagnosis not present

## 2023-10-23 LAB — COMPREHENSIVE METABOLIC PANEL
ALT: 26 U/L (ref 0–44)
AST: 40 U/L (ref 15–41)
Albumin: 2.2 g/dL — ABNORMAL LOW (ref 3.5–5.0)
Alkaline Phosphatase: 91 U/L (ref 38–126)
Anion gap: 9 (ref 5–15)
BUN: 44 mg/dL — ABNORMAL HIGH (ref 8–23)
CO2: 17 mmol/L — ABNORMAL LOW (ref 22–32)
Calcium: 8.2 mg/dL — ABNORMAL LOW (ref 8.9–10.3)
Chloride: 112 mmol/L — ABNORMAL HIGH (ref 98–111)
Creatinine, Ser: 1.39 mg/dL — ABNORMAL HIGH (ref 0.44–1.00)
GFR, Estimated: 40 mL/min — ABNORMAL LOW (ref >60)
Glucose, Bld: 108 mg/dL — ABNORMAL HIGH (ref 70–99)
Potassium: 4.1 mmol/L (ref 3.5–5.1)
Sodium: 138 mmol/L (ref 135–145)
Total Bilirubin: 0.5 mg/dL (ref <1.2)
Total Protein: 5.6 g/dL — ABNORMAL LOW (ref 6.5–8.1)

## 2023-10-23 LAB — CBC
HCT: 33 % — ABNORMAL LOW (ref 36.0–46.0)
Hemoglobin: 10.4 g/dL — ABNORMAL LOW (ref 12.0–15.0)
MCH: 28 pg (ref 26.0–34.0)
MCHC: 31.5 g/dL (ref 30.0–36.0)
MCV: 88.9 fL (ref 80.0–100.0)
Platelets: 135 10*3/uL — ABNORMAL LOW (ref 150–400)
RBC: 3.71 MIL/uL — ABNORMAL LOW (ref 3.87–5.11)
RDW: 14.9 % (ref 11.5–15.5)
WBC: 10.4 10*3/uL (ref 4.0–10.5)
nRBC: 0 % (ref 0.0–0.2)

## 2023-10-23 LAB — URINE CULTURE: Culture: NO GROWTH

## 2023-10-23 LAB — MAGNESIUM: Magnesium: 2 mg/dL (ref 1.7–2.4)

## 2023-10-23 MED ORDER — SINCALIDE 5 MCG IJ SOLR
0.0200 ug/kg | Freq: Once | INTRAMUSCULAR | Status: AC
  Administered 2023-10-23: 1.8 ug via INTRAVENOUS

## 2023-10-23 MED ORDER — APIXABAN 5 MG PO TABS
5.0000 mg | ORAL_TABLET | Freq: Two times a day (BID) | ORAL | Status: DC
  Administered 2023-10-23: 5 mg via ORAL
  Filled 2023-10-23: qty 1

## 2023-10-23 MED ORDER — SINCALIDE 5 MCG IJ SOLR
INTRAMUSCULAR | Status: AC
  Filled 2023-10-23: qty 5

## 2023-10-23 MED ORDER — DOXYCYCLINE HYCLATE 100 MG PO TABS: 100.0000 mg | ORAL_TABLET | Freq: Two times a day (BID) | ORAL | Status: DC

## 2023-10-23 MED ORDER — TECHNETIUM TC 99M MEBROFENIN IV KIT
5.0000 | PACK | Freq: Once | INTRAVENOUS | Status: AC | PRN
  Administered 2023-10-23: 5.5 via INTRAVENOUS

## 2023-10-23 MED ORDER — APIXABAN 5 MG PO TABS: 5.0000 mg | ORAL_TABLET | Freq: Two times a day (BID) | ORAL | 1 refills | Status: DC

## 2023-10-23 MED ORDER — SODIUM CHLORIDE 0.9 % IV SOLN
2.0000 g | Freq: Two times a day (BID) | INTRAVENOUS | Status: DC
  Administered 2023-10-23: 2 g via INTRAVENOUS
  Filled 2023-10-23: qty 12.5

## 2023-10-23 MED ORDER — CEFDINIR 300 MG PO CAPS: 300.0000 mg | ORAL_CAPSULE | Freq: Two times a day (BID) | ORAL | Status: DC

## 2023-10-23 MED ORDER — CEFDINIR 300 MG PO CAPS: 300.0000 mg | ORAL_CAPSULE | Freq: Two times a day (BID) | ORAL | 0 refills | Status: DC

## 2023-10-23 MED ORDER — STERILE WATER FOR INJECTION IJ SOLN
INTRAMUSCULAR | Status: AC
Start: 2023-10-23 — End: ?
  Filled 2023-10-23: qty 10

## 2023-10-23 MED ORDER — DOXYCYCLINE HYCLATE 100 MG PO TABS: 100.0000 mg | ORAL_TABLET | Freq: Two times a day (BID) | ORAL | 0 refills | Status: DC

## 2023-10-23 NOTE — Progress Notes (Addendum)
Mobility Specialist Progress Note:    10/23/23 1500  Mobility  Activity Refused mobility   Pt eager for discharge, refused mobility. All needs met.   Christine Gutierrez Mobility Specialist Please contact via Special educational needs teacher or  Rehab office at [phone]

## 2023-10-23 NOTE — Discharge Instructions (Signed)

## 2023-10-23 NOTE — Progress Notes (Signed)
   Patient Name: Christine Gutierrez Date of Encounter: 10/23/2023 [facility] Cardiologist: None    Interval Summary  .    Feeling better. Just weak.  Vital Signs .    Vitals:   10/23/23 0500 10/23/23 0700 10/23/23 0754 10/23/23 0800  BP: (!) 156/63 (!) 138/52  123/60  Pulse: 92 89  82  Resp: 19 (!) 22  (!) 23  Temp:   98.5 F (36.9 C)   TempSrc:   Oral   SpO2: 94% 94%  94%  Weight:      Height:        Intake/Output Summary (Last 24 hours) at 10/23/2023 0845 Last data filed at 10/23/2023 0600 Gross per 24 hour  Intake 1862.98 ml  Output 1700 ml  Net 162.98 ml      10/22/2023    5:00 AM 10/21/2023    3:20 PM 07/14/2022    1:52 PM  Last 3 Weights  Weight (lbs) 202 lb 9.6 oz 194 lb 0.1 oz 194 lb  Weight (kg) 91.9 kg 88 kg 87.998 kg      Telemetry/ECG    Afib with PVC's just converted to NSR - Personally Reviewed  Physical Exam .   GEN: No acute distress.   Neck: No JVD Cardiac:  RRR, no murmurs, rubs, or gallops.  Respiratory: Clear to auscultation bilaterally. GI: Soft, nontender, non-distended  MS: No edema  Assessment & Plan .      Afib rate controlled in setting of severe sepsis, slow rates with pauses through the night. Just converted to NSR. Suspected sleep apnea, will need OP sleep study currently  Crt improved 1.39. will start Eliquis and arrange OP f/u.  Echo normal LV function, severe asymmetric LVH no valvular heart disease.   History of rheumatic fever as a child.    AKI Crt 3.81>2.78>1.39   SIRS per primary team   Sepsis per primary team   HTN For questions or updates, please contact [facility] Please consult [email or web address] for contact info under        Signed, Jacolyn Reedy, PA-C

## 2023-10-23 NOTE — Progress Notes (Signed)
Pharmacy Antibiotic Note  Christine Gutierrez is a 72 y.o. female admitted on 10/21/2023 with sepsis.  Pharmacy has been consulted for Cefepime dosing. WBC is elevated. Noted renal dysfunction. Renal function improved Scr 2.78> 1.39  Plan: Change Cefepime 2g IV q12h Doxy/Flagyl per MD Trend WBC, temp, renal function  Height: 5\' 4"  (162.6 cm) Weight: 91.9 kg (202 lb 9.6 oz) IBW/kg (Calculated) : 54.7  Temp (24hrs), Avg:98.4 F (36.9 C), Min:98.2 F (36.8 C), Max:98.5 F (36.9 C)  Recent Labs  Lab 10/21/23 1544 10/21/23 1722 10/22/23 0426 10/23/23 0432  WBC 12.4*  --  6.7 10.4  CREATININE 3.81*  --  2.78* 1.39*  LATICACIDVEN 2.3* 1.4  --   --     Estimated Creatinine Clearance: 39.6 mL/min (A) (by C-G formula based on SCr of 1.39 mg/dL (H)).    Allergies  Allergen Reactions   Nitrofurantoin Nausea Only   Sulfa Antibiotics Anaphylaxis    Pt doesn't believe she is allergic to this   Statins Other (See Comments)    Elder Cyphers, BS Loura Back, BCPS Clinical Pharmacist Pager [phone]

## 2023-10-23 NOTE — Telephone Encounter (Signed)
Patient Product/process development scientist completed.    The patient is insured through [employer]. Patient has Medicare and is not eligible for a copay card, but may be able to apply for patient assistance, if available.    Ran test claim for Eliquis 5 mg and the current 30 day co-pay is $197.00 due to a $150.00 deductible.  Will be $47.00 once deductible is met.   This test claim was processed through [facility]- copay amounts may vary at other pharmacies due to pharmacy/plan contracts, or as the patient moves through the different stages of their insurance plan.     Roland Earl, CPHT Pharmacy Technician III Certified Patient Advocate [facility] Pharm[facility] Patient Advocate Team Direct Number: [phone]  Fax: [phone]

## 2023-10-23 NOTE — Progress Notes (Signed)
PHARMACY - ANTICOAGULATION CONSULT NOTE  Pharmacy Consult for Eliquis Indication: atrial fibrillation  Allergies  Allergen Reactions   Nitrofurantoin Nausea Only   Sulfa Antibiotics Anaphylaxis    Pt doesn't believe she is allergic to this   Statins Other (See Comments)    Patient Measurements: Height: 5\' 4"  (162.6 cm) Weight: 91.9 kg (202 lb 9.6 oz) IBW/kg (Calculated) : 54.7  Vital Signs: Temp: 98.5 F (36.9 C) (11/19 0754) Temp Source: Oral (11/19 0754) BP: 123/60 (11/19 0800) Pulse Rate: 82 (11/19 0800)  Labs: Recent Labs    10/21/23 1544 10/22/23 0426 10/22/23 0856 10/23/23 0432  HGB 13.3 10.3*  --  10.4*  HCT 40.2 31.2*  --  33.0*  PLT 105* 89*  --  135*  APTT 34  --  30  --   LABPROT 13.9  --  14.5  --   INR 1.1  --  1.1  --   CREATININE 3.81* 2.78*  --  1.39*  CKTOTAL 207  --   --   --   TROPONINIHS 46*  --   --   --     Estimated Creatinine Clearance: 39.6 mL/min (A) (by C-G formula based on SCr of 1.39 mg/dL (H)).   Medical History: Past Medical History:  Diagnosis Date   Hypertension     Medications:  Medications Prior to Admission  Medication Sig Dispense Refill Last Dose   amLODipine (NORVASC) 2.5 MG tablet Take 1 tablet by mouth daily.   Past Week   amLODipine (NORVASC) 5 MG tablet Take 1 tablet by mouth daily.   Past Week   amoxicillin (AMOXIL) 875 MG tablet Take 875 mg by mouth 2 (two) times daily. For 7 days   10/19/2023   diclofenac (VOLTAREN) 75 MG EC tablet Take 75 mg by mouth daily as needed for mild pain (pain score 1-3).  1 10/20/2023   levothyroxine (SYNTHROID, LEVOTHROID) 25 MCG tablet Take 25 mcg by mouth daily before breakfast.   10/21/2023   valsartan (DIOVAN) 160 MG tablet Take 160 mg by mouth daily.   10/20/2023    Assessment: Patient with severe sepsis and now with new onset afib, rate controlled. AKI improved 2.78> 1.39.  Pharmacy asked to start eliquis  Goal of Therapy:  Monitor platelets by anticoagulation protocol:  Yes   Plan:  Eliquis 5mg  po bid Educate on eliquis Monitor for S/S of bleeding  Elder Cyphers, BS Pharm D, BCPS Clinical Pharmacist 10/23/2023,9:14 AM

## 2023-10-23 NOTE — Plan of Care (Signed)

## 2023-10-23 NOTE — Discharge Summary (Signed)
Physician Discharge Summary   Patient: Christine Gutierrez MRN: [id number] DOB: 1951/02/23  Admit date:     10/21/2023  Discharge date: 10/23/23  Discharge Physician: Onalee Hua Farha Dano   PCP: Lorelei Pont, DO   Recommendations at discharge:   Please follow up with primary care provider within 1-2 weeks  Please repeat BMP and CBC in one week     Hospital Course: 72 year old female with history of hypertension, hyperlipidemia, CKD, hypothyroidism presenting with generalized weakness for about 2 weeks.  The patient states that she went to her PCP and was started on amoxicillin 875 mg twice daily with some improvement of her urinary symptoms.  Reviewed the medical records shows that the patient received an injection of ceftriaxone on 10/03/2023 and was started on amoxicillin at that time.  However, the patient continued to have worsening generalized weakness with fevers and chills.  She stated that she had a fever up to 101.0 F on 10/20/2023.  She also endorsed having some shortness of breath and coughing.  She denies any hemoptysis.  She denies any vomiting, diarrhea.  She complains of some upper abdominal discomfort.  She denies any hematochezia or melena.  She denies any rashes or headache or neck pain. Apparently, the patient fell out of bed about 5 days prior to admission.  She states she decided to lay on the floor for the next 3 days.  She reported that any type by her family her husband to get up would be unsuccessful because the patient which is screaming pain all over.  The family was subsequently able to get up into her bed where she remained for the next 2 days.  She continued to have poor oral intake and fevers and chills.  She did complain of some nausea without any emesis.  She complained of diffuse abdominal pain.  Subsequently, her family brought her to the emergency department by private vehicle. In the ED, the patient was afebrile.  She was hypotensive with blood pressure 69/51.  The  patient was given the appropriate fluid boluses per sepsis protocol.  She was empirically started on vancomycin and cefepime and subsequently maintenance IV fluids. Sodium 132, potassium 3.9, bicarbonate 17, BUN 74, creatinine 3.81.  WBC 12.4, hemoglobin 13.3, platelets 105.  Lactic acid 2.3>> 1.4 PCT 2.36.  CPK 207. ABG 7.3 05/02/71/16.  BNP 108  Assessment and Plan:  SIRS -Patient presented with leukocytosis and hypotension and hypothermia 95.7 F -Lactic acid 2.3>> 1.4 -PCT 2.36 -UA 6-10 WBC -Personally reviewed chest x-ray bilateral interstitial opacities -Continue IV fluids -started on doxycycline and cefepime -Suspect patient has atypical pneumonia  -blood cultures remain negative -COVID-19 PCR negative -Check viral respiratory panel--neg -10/21/2023 CT abdomen and pelvis--age-indeterminate T11 compression fracture; no bowel wall thickening or obstruction -HR, RR, BP improved   Acute respiratory failure with hypoxia -Oxygen saturation 89% on room air with tachypnea -Suspect atypical pneumonia -Stable on 2-3 L>>weaned to RA -Urinary Legionella antigen -Viral respiratory panel--neg -started on cefepime and doxycycline -MRSA screen negative -d/c with cefdinir and doxy x 4 more days   AKI -Baseline creatinine 0.8-1.1 -Presented with serum creatinine 3.1 -Renal ultrasound--no hydronephrosis -serum creatinine 1.39 on day of d/c   New onset atrial fibrillation -Personally reviewed EKG--atrial fibrillation, no concerning ST-T wave change -spontaneously converted to sinus -Echo EF 60-65%, no WMA, normal RVF -Cardiology consult appreciated -rate controlled -apixaban started -TSH 6.254   Generalized weakness -Secondary to infectious process -B12--257 -Folic acid--7.8 -PT evaluation>>HHPT -CT brain negative for acute findings  Essential hypertension -Initially hypotensive -Holding amlodipine and valsartan -will not restart valsartan due to AKI -restart amlodipine    Hyperglycemia -11/17 A1c--6.2 -follow up PCP   Thrombocytopenia -due to sepsis and splenomegaly -B12--257 -folate--7.8 -[place] abd--mild splenomegaly  -check coags--normal -improving with abx        Consultants: cardiology Procedures performed: none  Disposition: Home Diet recommendation:  Cardiac and Carb modified diet DISCHARGE MEDICATION: Allergies as of 10/23/2023       Reactions   Nitrofurantoin Nausea Only   Sulfa Antibiotics Anaphylaxis   Pt doesn't believe she is allergic to this   Statins Other (See Comments)        Medication List     STOP taking these medications    amoxicillin 875 MG tablet Commonly known as: AMOXIL   diclofenac 75 MG EC tablet Commonly known as: VOLTAREN   valsartan 160 MG tablet Commonly known as: DIOVAN       TAKE these medications    amLODipine 2.5 MG tablet Commonly known as: NORVASC Take 1 tablet by mouth daily.   amLODipine 5 MG tablet Commonly known as: NORVASC Take 1 tablet by mouth daily.   apixaban 5 MG Tabs tablet Commonly known as: ELIQUIS Take 1 tablet (5 mg total) by mouth 2 (two) times daily.   cefdinir 300 MG capsule Commonly known as: OMNICEF Take 1 capsule (300 mg total) by mouth every 12 (twelve) hours. Start taking on: October 24, 2023   doxycycline 100 MG tablet Commonly known as: VIBRA-TABS Take 1 tablet (100 mg total) by mouth every 12 (twelve) hours.   levothyroxine 25 MCG tablet Commonly known as: SYNTHROID Take 25 mcg by mouth daily before breakfast.        Discharge Exam: Filed Weights   10/21/23 1520 10/22/23 0500  Weight: 88 kg 91.9 kg   HEENT:  [place]/AT, No thrush, no icterus CV:  RRR, no rub, no S3, no S4 Lung:  bibasilar rale.  No wheeze Abd:  soft/+BS, NT Ext:  No edema, no lymphangitis, no synovitis, no rash   Condition at discharge: stable  The results of significant diagnostics from this hospitalization (including imaging, microbiology, ancillary and laboratory)  are listed below for reference.   Imaging Studies: NM Hepato W/EF  Result Date: 10/23/2023 CLINICAL DATA:  Right upper quadrant abdominal pain, biliary disease suspected. EXAM: NUCLEAR MEDICINE HEPATOBILIARY IMAGING WITH GALLBLADDER EF TECHNIQUE: Sequential images of the abdomen were obtained out to 60 minutes following intravenous administration of radiopharmaceutical. After slow intravenous infusion of 1.8 micrograms Cholecystokinin, gallbladder ejection fraction was determined. RADIOPHARMACEUTICALS:  5.5 mCi Tc-1m Choletec IV COMPARISON:  Ultrasound October 18, 2023 FINDINGS: Slightly delayed radiotracer clearance from blood pool and background liver. Prompt biliary excretion is seen. Gallbladder activity is visualized, consistent with patency of cystic duct. Biliary activity passes into small bowel, consistent with patent common bile duct. Gallbladder continues to fill with radiotracer post administration of CCK without significant radiotracer excretion from the gallbladder producing a negative ejection fraction. IMPRESSION: Slightly delayed radiotracer clearance from blood pool and background liver may reflect hepatitis. Gallbladder continues to fill with radiotracer post administration of CCK without significant radiotracer excretion from the gallbladder producing a negative ejection fraction. Findings are suggestive of biliary dyskinesia/chronic cholecystitis. However in the setting of possible hepatitis the ejection fraction may be artificially reduced. Suggest correlation for hepatitis with laboratory values. Electronically Signed   By: Maudry Mayhew M.D.   On: 10/23/2023 14:12   US Abdomen Complete  Result Date: 10/22/2023 CLINICAL DATA:  Acute kidney injury EXAM: ABDOMEN ULTRASOUND COMPLETE COMPARISON:  CT 10/21/2023. FINDINGS: Gallbladder: Dilated gallbladder with sludge, wall thickening. Few tiny stones as well. Common bile duct: Diameter: 6 mm Liver: No focal lesion identified. Within  normal limits in parenchymal echogenicity. Portal vein is patent on color Doppler imaging with normal direction of blood flow towards the liver. IVC: No abnormality visualized. Pancreas: Visualized portion unremarkable. Spleen: Measures enlarged at 15.5 cm. Right Kidney: Length: 12.2. Echogenicity within normal limits. No mass or hydronephrosis visualized. Left Kidney: Length: 10.5. Echogenicity within normal limits. No mass or hydronephrosis visualized. Abdominal aorta: No aneurysm visualized. Other findings: Trace bilateral pleural fluid. IMPRESSION: Distended gallbladder with sludge and small stones. There also wall thickening. Please correlate for other clinical evidence of acute cholecystitis. If needed follow up HIDA scan as clinically appropriate. No ductal dilatation. Mild splenic enlargement. Trace bilateral pleural fluid. Electronically Signed   By: Karen Kays M.D.   On: 10/22/2023 16:53   ECHOCARDIOGRAM COMPLETE  Result Date: 10/22/2023    ECHOCARDIOGRAM REPORT   Patient Name:   JAMEI DAUN Date of Exam: 10/22/2023 Medical Rec #:  [id number]              Height:       64.0 in Accession #:    [id number]             Weight:       202.6 lb Date of Birth:  10-17-1951             BSA:          1.967 m Patient Age:    71 years               BP:           142/47 mmHg Patient Gender: F                      HR:           86 bpm. [facility] Procedure: 2D Echo, Cardiac Doppler, Color Doppler and Intracardiac            Opacification Agent Indications:    Atrial Fibrillation I48.91  History:        Patient has no prior history of Echocardiogram examinations.                 Arrythmias:Atrial Fibrillation; Risk Factors:Hypertension.  Sonographer:    Celesta Gentile RCS Referring Phys: [id number] Ysabella Babiarz IMPRESSIONS  1. Left ventricular ejection fraction, by estimation, is 60 to 65%. The left ventricle has normal function. The left ventricle has no regional wall motion abnormalities. There  is severe asymmetric left ventricular hypertrophy of the septal segment. Left  ventricular diastolic function could not be evaluated.  2. Right ventricular systolic function is normal. The right ventricular size is normal. There is normal pulmonary artery systolic pressure.  3. Left atrial size was mildly dilated.  4. Right atrial size was mildly dilated.  5. The mitral valve is normal in structure. Trivial mitral valve regurgitation. No evidence of mitral stenosis.  6. The aortic valve is tricuspid. Aortic valve regurgitation is not visualized. No aortic stenosis is present.  7. The inferior vena cava is normal in size with greater than 50% respiratory variability, suggesting right atrial pressure of 3 mmHg. Comparison(s): No prior Echocardiogram. FINDINGS  Left Ventricle: Left ventricular ejection fraction, by estimation, is 60 to 65%. The left ventricle has normal function. The left ventricle  has no regional wall motion abnormalities. Definity contrast agent was given IV to delineate the left ventricular  endocardial borders. The left ventricular internal cavity size was normal in size. There is severe asymmetric left ventricular hypertrophy of the septal segment. Left ventricular diastolic function could not be evaluated due to atrial fibrillation. Left  ventricular diastolic function could not be evaluated. Right Ventricle: The right ventricular size is normal. No increase in right ventricular wall thickness. Right ventricular systolic function is normal. There is normal pulmonary artery systolic pressure. The tricuspid regurgitant velocity is 2.23 m/s, and  with an assumed right atrial pressure of 3 mmHg, the estimated right ventricular systolic pressure is 22.9 mmHg. Left Atrium: Left atrial size was mildly dilated. Right Atrium: Right atrial size was mildly dilated. Pericardium: There is no evidence of pericardial effusion. Mitral Valve: The mitral valve is normal in structure. Trivial mitral valve  regurgitation. No evidence of mitral valve stenosis. Tricuspid Valve: The tricuspid valve is not well visualized. Tricuspid valve regurgitation is trivial. No evidence of tricuspid stenosis. Aortic Valve: The aortic valve is tricuspid. Aortic valve regurgitation is not visualized. No aortic stenosis is present. Pulmonic Valve: The pulmonic valve was not well visualized. Pulmonic valve regurgitation is trivial. No evidence of pulmonic stenosis. Aorta: The aortic root is normal in size and structure. Venous: The inferior vena cava is normal in size with greater than 50% respiratory variability, suggesting right atrial pressure of 3 mmHg. IAS/Shunts: No atrial level shunt detected by color flow Doppler.  LEFT VENTRICLE PLAX 2D LVIDd:         4.00 cm LVIDs:         2.60 cm LV PW:         1.30 cm LV IVS:        1.50 cm LVOT diam:     1.90 cm LV SV:         71 LV SV Index:   36 LVOT Area:     2.84 cm  RIGHT VENTRICLE RV S prime:     11.70 cm/s TAPSE (M-mode): 2.3 cm LEFT ATRIUM             Index        RIGHT ATRIUM           Index LA diam:        4.40 cm 2.24 cm/m   RA Area:     23.50 cm LA Vol (A2C):   76.3 ml 38.79 ml/m  RA Volume:   75.30 ml  38.28 ml/m LA Vol (A4C):   62.5 ml 31.77 ml/m LA Biplane Vol: 71.1 ml 36.14 ml/m  AORTIC VALVE LVOT Vmax:   125.00 cm/s LVOT Vmean:  85.700 cm/s LVOT VTI:    0.251 m  AORTA Ao Root diam: 3.60 cm MITRAL VALVE                TRICUSPID VALVE MV Area (PHT): 4.60 cm     TR Peak grad:   19.9 mmHg MV Decel Time: 165 msec     TR Vmax:        223.00 cm/s MV E velocity: 109.00 cm/s                             SHUNTS                             Systemic VTI:  0.25  m                             Systemic Diam: 1.90 cm Vishnu Priya Mallipeddi Electronically signed by Winfield Rast Mallipeddi Signature Date/Time: 10/22/2023/10:44:03 AM    Final    CT CHEST ABDOMEN PELVIS WO CONTRAST  Result Date: 10/21/2023 CLINICAL DATA:  Sepsis SIRS, AKI on CKD, Anuic. EXAM: CT CHEST, ABDOMEN AND  PELVIS WITHOUT CONTRAST TECHNIQUE: Multidetector CT imaging of the chest, abdomen and pelvis was performed following the standard protocol without IV contrast. RADIATION DOSE REDUCTION: This exam was performed according to the departmental dose-optimization program which includes automated exposure control, adjustment of the mA and/or kV according to patient size and/or use of iterative reconstruction technique. COMPARISON:  None Available. FINDINGS: CT CHEST FINDINGS Cardiovascular: Heart is normal size. Aorta is normal caliber. Coronary artery and aortic atherosclerosis. Mediastinum/Nodes: No mediastinal, hilar, or axillary adenopathy. Trachea and esophagus are unremarkable. Thyroid unremarkable. Lungs/Pleura: Minimal dependent atelectasis in the lower lobes. No confluent opacities or effusions. Musculoskeletal: Chest wall soft tissues are unremarkable. Mild compression through the superior endplate of T11, age indeterminate. CT ABDOMEN PELVIS FINDINGS Hepatobiliary: No focal hepatic abnormality. Gallbladder unremarkable. Pancreas: No focal abnormality or ductal dilatation. Spleen: No focal abnormality.  Normal size. Adrenals/Urinary Tract: No adrenal abnormality. No focal renal abnormality. No stones or hydronephrosis. Urinary bladder is unremarkable. Foley catheter is within the vagina rather than the urinary bladder. Stomach/Bowel: Diffuse colonic diverticulosis. No active diverticulitis. Stomach and small bowel decompressed. No bowel obstruction. Vascular/Lymphatic: Aortic atherosclerosis. No evidence of aneurysm or adenopathy. Reproductive: Uterus and adnexa unremarkable. No mass. Again, Foley catheter noted within the vagina. Other: No free fluid or free air. Musculoskeletal: No acute bony abnormality. IMPRESSION: Dependent atelectasis in the lower lobes. Coronary artery disease, aortic atherosclerosis. No acute cardiopulmonary disease. Diffuse colonic diverticulosis.  No active diverticulitis. Foley  catheter balloon noted within the vagina rather than the urinary bladder. No acute findings in the abdomen or pelvis. These results will be called to the ordering clinician or representative by the Radiologist Assistant, and communication documented in the PACS or [employer]. Electronically Signed   By: Charlett Nose M.D.   On: 10/21/2023 19:23   DG Chest Port 1 View  Result Date: 10/21/2023 CLINICAL DATA:  Sepsis EXAM: PORTABLE CHEST 1 VIEW COMPARISON:  None Available. FINDINGS: Heart size is mildly enlarged. Aortic atherosclerosis. Mild diffuse interstitial prominence. No appreciable pleural fluid collection. No pneumothorax. IMPRESSION: Mild diffuse interstitial prominence, which may reflect edema or atypical/viral infection. Electronically Signed   By: Duanne Guess D.O.   On: 10/21/2023 16:46   DG Pelvis Portable  Result Date: 10/21/2023 CLINICAL DATA:  Fever chills and nausea. EXAM: PORTABLE PELVIS 1-2 VIEWS COMPARISON:  None Available. FINDINGS: There is no evidence of pelvic fracture or diastasis. No pelvic bone lesions are seen. Normal bowel gas pattern. IMPRESSION: Negative. Electronically Signed   By: Ted Mcalpine M.D.   On: 10/21/2023 16:39   CT Head Wo Contrast  Result Date: 10/21/2023 CLINICAL DATA:  Polytrauma, blunt EXAM: CT HEAD WITHOUT CONTRAST TECHNIQUE: Contiguous axial images were obtained from the base of the skull through the vertex without intravenous contrast. RADIATION DOSE REDUCTION: This exam was performed according to the departmental dose-optimization program which includes automated exposure control, adjustment of the mA and/or kV according to patient size and/or use of iterative reconstruction technique. COMPARISON:  MRI head August 11, 23. FINDINGS: Brain: Patchy white matter no  evidence of acute infarction, hemorrhage, hydrocephalus, extra-axial collection or mass lesion/mass effect. Hypodensities are nonspecific but compatible with chronic  microvascular ischemic change. Vascular: Calcific atherosclerosis. Skull: No acute fracture. Sinuses/Orbits: Clear sinuses.  No acute findings. Other: No mastoid effusions. IMPRESSION: No evidence of acute intracranial abnormality. Electronically Signed   By: Feliberto Harts M.D.   On: 10/21/2023 16:33    Microbiology: Results for orders placed or performed during the hospital encounter of 10/21/23  Blood Culture (routine x 2)     Status: None (Preliminary result)   Collection Time: 10/21/23  3:44 PM   Specimen: Right Antecubital; Blood  Result Value Ref Range Status   Specimen Description   Final    RIGHT ANTECUBITAL BOTTLES DRAWN AEROBIC AND ANAEROBIC   Special Requests Blood Culture adequate volume  Final   Culture   Final    NO GROWTH 2 DAYS Performed at [facility], [address], [place], [place] [address]    Report Status PENDING  Incomplete  Blood Culture (routine x 2)     Status: None (Preliminary result)   Collection Time: 10/21/23  3:44 PM   Specimen: BLOOD LEFT HAND  Result Value Ref Range Status   Specimen Description   Final    BLOOD LEFT HAND BOTTLES DRAWN AEROBIC AND ANAEROBIC   Special Requests Blood Culture adequate volume  Final   Culture   Final    NO GROWTH 2 DAYS Performed at [facility], [address], [place], [place] [address]    Report Status PENDING  Incomplete  Resp panel by RT-PCR (RSV, Flu A&B, Covid) Anterior Nasal Swab     Status: None   Collection Time: 10/21/23  4:06 PM   Specimen: Anterior Nasal Swab  Result Value Ref Range Status   SARS Coronavirus 2 by RT PCR NEGATIVE NEGATIVE Final    Comment: (NOTE) SARS-CoV-2 target nucleic acids are NOT DETECTED.  The SARS-CoV-2 RNA is generally detectable in upper respiratory specimens during the acute phase of infection. The lowest concentration of SARS-CoV-2 viral copies this assay can detect is 138 copies/mL. A negative result does not preclude SARS-Cov-2 infection and should not be used as the  sole basis for treatment or other patient management decisions. A negative result may occur with  improper specimen collection/handling, submission of specimen other than nasopharyngeal swab, presence of viral mutation(s) within the areas targeted by this assay, and inadequate number of viral copies(<138 copies/mL). A negative result must be combined with clinical observations, patient history, and epidemiological information. The expected result is Negative.  Fact Sheet for Patients:  BloggerCourse.com  Fact Sheet for Healthcare Providers:  SeriousBroker.it  This test is no t yet approved or cleared by the [place] FDA and  has been authorized for detection and/or diagnosis of SARS-CoV-2 by FDA under an Emergency Use Authorization (EUA). This EUA will remain  in effect (meaning this test can be used) for the duration of the COVID-19 declaration under Section 564(b)(1) of the Act, 21 U.S.C.section 360bbb-3(b)(1), unless the authorization is terminated  or revoked sooner.       Influenza A by PCR NEGATIVE NEGATIVE Final   Influenza B by PCR NEGATIVE NEGATIVE Final    Comment: (NOTE) The Xpert Xpress SARS-CoV-2/FLU/RSV plus assay is intended as an aid in the diagnosis of influenza from Nasopharyngeal swab specimens and should not be used as a sole basis for treatment. Nasal washings and aspirates are unacceptable for Xpert Xpress SARS-CoV-2/FLU/RSV testing.  Fact Sheet for Patients: BloggerCourse.com  Fact  Sheet for Healthcare Providers: SeriousBroker.it  This test is not yet approved or cleared by the [place] and has been authorized for detection and/or diagnosis of SARS-CoV-2 by FDA under an Emergency Use Authorization (EUA). This EUA will remain in effect (meaning this test can be used) for the duration of the COVID-19 declaration under Section 564(b)(1) of the  Act, 21 U.S.C. section 360bbb-3(b)(1), unless the authorization is terminated or revoked.     Resp Syncytial Virus by PCR NEGATIVE NEGATIVE Final    Comment: (NOTE) Fact Sheet for Patients: BloggerCourse.com  Fact Sheet for Healthcare Providers: SeriousBroker.it  This test is not yet approved or cleared by the [place] FDA and has been authorized for detection and/or diagnosis of SARS-CoV-2 by FDA under an Emergency Use Authorization (EUA). This EUA will remain in effect (meaning this test can be used) for the duration of the COVID-19 declaration under Section 564(b)(1) of the Act, 21 U.S.C. section 360bbb-3(b)(1), unless the authorization is terminated or revoked.  Performed at [facility], [address], [place], [place] [address]   MRSA Next Gen by PCR, Nasal     Status: None   Collection Time: 10/21/23  6:22 PM   Specimen: Nasal Mucosa; Nasal Swab  Result Value Ref Range Status   MRSA by PCR Next Gen NOT DETECTED NOT DETECTED Final    Comment: (NOTE) The GeneXpert MRSA Assay (FDA approved for NASAL specimens only), is one component of a comprehensive MRSA colonization surveillance program. It is not intended to diagnose MRSA infection nor to guide or monitor treatment for MRSA infections. Test performance is not FDA approved in patients less than 54 years old. Performed at [facility], [address] [place], [place] [address]   Urine Culture (for pregnant, neutropenic or urologic patients or patients with an indwelling urinary catheter)     Status: None   Collection Time: 10/21/23  8:00 PM   Specimen: Urine, Clean Catch  Result Value Ref Range Status   Specimen Description   Final    URINE, CLEAN CATCH Performed at [facility], [address], [place], [place] [address]    Special Requests   Final    NONE Performed at [facility], [address], [place], [place] [address]    Culture   Final    NO  GROWTH Performed at [facility], [address], [place], [place] [address]    Report Status 10/23/2023 FINAL  Final  Respiratory (~20 pathogens) panel by PCR     Status: None   Collection Time: 10/22/23  9:32 AM   Specimen: Nasopharyngeal Swab; Respiratory  Result Value Ref Range Status   Adenovirus NOT DETECTED NOT DETECTED Final   Coronavirus 229E NOT DETECTED NOT DETECTED Final    Comment: (NOTE) The Coronavirus on the Respiratory Panel, DOES NOT test for the novel  Coronavirus (2019 nCoV)    Coronavirus HKU1 NOT DETECTED NOT DETECTED Final   Coronavirus NL63 NOT DETECTED NOT DETECTED Final   Coronavirus OC43 NOT DETECTED NOT DETECTED Final   Metapneumovirus NOT DETECTED NOT DETECTED Final   Rhinovirus / Enterovirus NOT DETECTED NOT DETECTED Final   Influenza A NOT DETECTED NOT DETECTED Final   Influenza B NOT DETECTED NOT DETECTED Final   Parainfluenza Virus 1 NOT DETECTED NOT DETECTED Final   Parainfluenza Virus 2 NOT DETECTED NOT DETECTED Final   Parainfluenza Virus 3 NOT DETECTED NOT DETECTED Final   Parainfluenza Virus 4 NOT DETECTED NOT DETECTED Final   Respiratory Syncytial  Virus NOT DETECTED NOT DETECTED Final   Bordetella pertussis NOT DETECTED NOT DETECTED Final   Bordetella Parapertussis NOT DETECTED NOT DETECTED Final   Chlamydophila pneumoniae NOT DETECTED NOT DETECTED Final   Mycoplasma pneumoniae NOT DETECTED NOT DETECTED Final    Comment: Performed at [facility], [address], [place], [place] [address]    Labs: CBC: Recent Labs  Lab 10/21/23 1544 10/22/23 0426 10/23/23 0432  WBC 12.4* 6.7 10.4  NEUTROABS 7.5  --   --   HGB 13.3 10.3* 10.4*  HCT 40.2 31.2* 33.0*  MCV 87.0 89.1 88.9  PLT 105* 89* 135*   Basic Metabolic Panel: Recent Labs  Lab 10/21/23 1544 10/22/23 0426 10/23/23 0432  NA 132* 136 138  K 3.9 3.5 4.1  CL 98 108 112*  CO2 17* 18* 17*  GLUCOSE 202* 147* 108*  BUN 74* 73* 44*  CREATININE 3.81* 2.78* 1.39*   CALCIUM 9.1 7.8* 8.2*  MG  --   --  2.0   Liver Function Tests: Recent Labs  Lab 10/21/23 1544 10/23/23 0432  AST 54* 40  ALT 30 26  ALKPHOS 111 91  BILITOT 0.9 0.5  PROT 7.2 5.6*  ALBUMIN 2.9* 2.2*   CBG: Recent Labs  Lab 10/21/23 1523  GLUCAP 177*    Discharge time spent: greater than 30 minutes.  Signed: Catarina Hartshorn, MD [facility] 10/23/2023

## 2023-10-23 NOTE — Care Management Important Message (Signed)
Important Message  Patient Details  Name: Christine Gutierrez MRN: [id number] Date of Birth: [birth year]   Important Message Given:  N/A - LOS <3 / Initial given by admissions     Corey Harold 10/23/2023, 3:27 PM

## 2023-10-24 LAB — LEGIONELLA PNEUMOPHILA SEROGP 1 UR AG: L. pneumophila Serogp 1 Ur Ag: NEGATIVE

## 2023-10-26 LAB — CULTURE, BLOOD (ROUTINE X 2)
Culture: NO GROWTH
Culture: NO GROWTH
Special Requests: ADEQUATE
Special Requests: ADEQUATE

## 2023-10-29 ENCOUNTER — Telehealth: Payer: Self-pay | Admitting: Internal Medicine

## 2023-10-29 DIAGNOSIS — N179 Acute kidney failure, unspecified: Secondary | ICD-10-CM

## 2023-10-29 NOTE — Telephone Encounter (Signed)
Nephrology referral placed

## 2023-10-29 NOTE — Telephone Encounter (Signed)
Pt would like a c/b regarding a referral to see a Nephrologist. Please advise

## 2023-10-29 NOTE — Telephone Encounter (Signed)
Requesting a referral to a nephrologist due to dx: of AKI while in hospital. Says she would like a local nephrologist and her PCP is in [place], [place] and he would refer her to a kidney doctor in that area. Advised that message would be sent to provider.

## 2023-11-15 ENCOUNTER — Encounter: Payer: Self-pay | Admitting: Nurse Practitioner

## 2023-11-15 ENCOUNTER — Telehealth: Payer: Self-pay | Admitting: Nurse Practitioner

## 2023-11-15 ENCOUNTER — Ambulatory Visit: Payer: Medicare HMO | Attending: Nurse Practitioner | Admitting: Nurse Practitioner

## 2023-11-15 VITALS — BP 136/68 | HR 70 | Ht 65.0 in | Wt 193.0 lb

## 2023-11-15 DIAGNOSIS — I4891 Unspecified atrial fibrillation: Secondary | ICD-10-CM | POA: Diagnosis not present

## 2023-11-15 DIAGNOSIS — I10 Essential (primary) hypertension: Secondary | ICD-10-CM | POA: Diagnosis not present

## 2023-11-15 DIAGNOSIS — E785 Hyperlipidemia, unspecified: Secondary | ICD-10-CM | POA: Diagnosis not present

## 2023-11-15 NOTE — Progress Notes (Signed)
Cardiology Office Note:  .   Date:  11/15/2023 ID:  Christine Gutierrez, DOB 1951-06-10, MRN [id number] PCP: Lorelei Pont, DO  [facility] Providers Cardiologist:  Marjo Bicker, MD    History of Present Illness: .   Christine Gutierrez is a 72 y.o. female with a PMH of A-fib, hypertension, hyperlipidemia, hypothyroidism, history of rheumatic fever as a child, and CKD, who presents today for hospital follow-up.  Recently hospitalized with SIRS, was previously treated by PCP OP.  She had a fall prior to hospital admission.  CT of abdomen and pelvis revealed a age-indeterminate T11 compression fracture, nothing else acute.  Was treated for acute respiratory failure with hypoxia and AKI.  Was diagnosed with new onset A-fib, cardiology was consulted and was started on Eliquis.  Echo revealed normal EF.  She converted to sinus rhythm on telemetry.  Recommended for outpatient OSA evaluation.  Today she presents for hospital follow-up. Doing much better and feeling better since leaving the hospital, does note some fatigue and rebuilding her strength after being hospitalized. Denies any chest pain, shortness of breath, palpitations, syncope, presyncope, dizziness, orthopnea, PND, swelling or significant weight changes, acute bleeding, or claudication.  She is not taking Eliquis and is taking aspirin, patient states she refuses to take Eliquis.  ROS: Negative. See HPI.   Studies Reviewed: .    Echo 10/2023:  1. Left ventricular ejection fraction, by estimation, is 60 to 65%. The  left ventricle has normal function. The left ventricle has no regional  wall motion abnormalities. There is severe asymmetric left ventricular  hypertrophy of the septal segment. Left   ventricular diastolic function could not be evaluated.   2. Right ventricular systolic function is normal. The right ventricular  size is normal. There is normal pulmonary artery systolic pressure.   3. Left atrial  size was mildly dilated.   4. Right atrial size was mildly dilated.   5. The mitral valve is normal in structure. Trivial mitral valve  regurgitation. No evidence of mitral stenosis.   6. The aortic valve is tricuspid. Aortic valve regurgitation is not  visualized. No aortic stenosis is present.   7. The inferior vena cava is normal in size with greater than 50%  respiratory variability, suggesting right atrial pressure of 3 mmHg.   Comparison(s): No prior Echocardiogram.  Physical Exam:   VS:  BP 136/68   Pulse 70   Ht 5\' 5"  (1.651 m)   Wt 193 lb (87.5 kg)   SpO2 98%   BMI 32.12 kg/m    Wt Readings from Last 3 Encounters:  11/15/23 193 lb (87.5 kg)  10/22/23 202 lb 9.6 oz (91.9 kg)  07/14/22 194 lb (88 kg)    GEN: Obese, 72 y.o. female in no acute distress NECK: No JVD; No carotid bruits CARDIAC: S1/S2, RRR, no murmurs, rubs, gallops RESPIRATORY:  Clear to auscultation without rales, wheezing or rhonchi  ABDOMEN: Soft, non-tender, non-distended EXTREMITIES:  No edema; No deformity   ASSESSMENT AND PLAN: .    A-fib Denies any tachycardia or palpitations.  Heart rate is well-controlled today and appears to be in sinus rhythm on exam.  A-fib etiology felt to be due to sepsis.  Will arrange 14 day Zio XT to evaluate for rhythm burden.  Patient has declined OAC at this time, explained to her stroke risk if she has persistent A-fib.  Arranging monitor as mentioned previously.  Continue current medication regimen. Care and ED precautions discussed.   HTN  Blood pressure stable. Discussed to monitor BP at home at least 2 hours after medications and sitting for 5-10 minutes.  Continue current medication regimen. Heart healthy diet and regular cardiovascular exercise encouraged.   HLD No recent labs on file.  Will request lab work at next office visit.  No medication changes at this time. Heart healthy diet and regular cardiovascular exercise encouraged. Continue to follow with PCP.       Dispo: Follow-up with me/APP in 6 to 8 weeks or sooner if any changes.  Signed, Sharlene Dory, NP

## 2023-11-15 NOTE — Telephone Encounter (Signed)
Checking percert on the following   Mailing ZIO XT 2 week monitor to her to do in the new year

## 2023-11-15 NOTE — Patient Instructions (Addendum)
Medication Instructions:  Your physician recommends that you continue on your current medications as directed. Please refer to the Current Medication list given to you today.  Labwork: None   Testing/Procedures: ZIO- Long Term Monitor Instructions   Your physician has requested you wear your ZIO patch monitor 14 days.   This is a single patch monitor.  [employer] supplies one patch monitor per enrollment.  Additional stickers are not available.   Please do not apply patch if you will be having a Nuclear Stress Test, Echocardiogram, Cardiac CT, MRI, or Chest Xray during the time frame you would be wearing the monitor. The patch cannot be worn during these tests.  You cannot remove and re-apply the ZIO XT patch monitor.   Your ZIO patch monitor will be sent USPS Priority mail from [facility] directly to your home address. The monitor may also be mailed to a PO BOX if home delivery is not available.   It may take 3-5 days to receive your monitor after you have been enrolled.   Once you have received you monitor, please review enclosed instructions.  Your monitor has already been registered assigning a specific monitor serial # to you.   Applying the monitor   Shave hair from upper left chest.   Hold abrader disc by orange tab.  Rub abrader in 40 strokes over left upper chest as indicated in your monitor instructions.   Clean area with 4 enclosed alcohol pads .  Use all pads to assure are is cleaned thoroughly.  Let dry.   Apply patch as indicated in monitor instructions.  Patch will be place under collarbone on left side of chest with arrow pointing upward.   Rub patch adhesive wings for 2 minutes.Remove white label marked "1".  Remove white label marked "2".  Rub patch adhesive wings for 2 additional minutes.   While looking in a mirror, press and release button in center of patch.  A small green light will flash 3-4 times .  This will be your only indicator the monitor has been  turned on.     Do not shower for the first 24 hours.  You may shower after the first 24 hours.   Press button if you feel a symptom. You will hear a small click.  Record Date, Time and Symptom in the Patient Log Book.   When you are ready to remove patch, follow instructions on last 2 pages of Patient Log Book.  Stick patch monitor onto last page of Patient Log Book.   Place Patient Log Book in Tuttle box.  Use locking tab on box and tape box closed securely.  The [employer] and [employer] has [employer] on it.  Please place in mailbox as soon as possible.  Your physician should have your test results approximately 7 days after the monitor has been mailed back to [facility].   Call [facility] Customer Care at [phone] if you have questions regarding your ZIO XT patch monitor.  Call them immediately if you see an orange light blinking on your monitor.   If your monitor falls off in less than 4 days contact our Monitor department at [phone].  If your monitor becomes loose or falls off after 4 days call [employer] at [phone] for suggestions on securing your monitor.  Follow-Up: Your physician recommends that you schedule a follow-up appointment in: March   Any Other Special Instructions Will Be Listed Below (If Applicable).  If you need a refill on your cardiac  medications before your next appointment, please call your pharmacy.

## 2023-11-19 ENCOUNTER — Ambulatory Visit: Payer: Medicare HMO | Admitting: Nurse Practitioner

## 2023-12-03 ENCOUNTER — Other Ambulatory Visit (HOSPITAL_COMMUNITY)
Admission: RE | Admit: 2023-12-03 | Discharge: 2023-12-03 | Disposition: A | Discharge status: Home / Self Care | Payer: Medicare HMO | Source: Ambulatory Visit | Attending: Nephrology | Admitting: Nephrology

## 2023-12-03 DIAGNOSIS — N390 Urinary tract infection, site not specified: Secondary | ICD-10-CM | POA: Diagnosis present

## 2023-12-03 LAB — URINALYSIS, W/ REFLEX TO CULTURE (INFECTION SUSPECTED)
Bilirubin Urine: NEGATIVE
Glucose, UA: NEGATIVE mg/dL
Hgb urine dipstick: NEGATIVE
Ketones, ur: NEGATIVE mg/dL
Nitrite: NEGATIVE
Protein, ur: 100 mg/dL — AB
Specific Gravity, Urine: 1.025 (ref 1.005–1.030)
WBC, UA: >50 WBC/hpf (ref 0–5)
pH: 5 (ref 5.0–8.0)

## 2023-12-05 LAB — URINE CULTURE: Culture: >=100000 — AB

## 2023-12-06 ENCOUNTER — Other Ambulatory Visit: Payer: Self-pay | Admitting: Nurse Practitioner

## 2023-12-06 ENCOUNTER — Ambulatory Visit: Payer: Medicare HMO | Attending: Nurse Practitioner

## 2023-12-06 DIAGNOSIS — I480 Paroxysmal atrial fibrillation: Secondary | ICD-10-CM

## 2023-12-18 ENCOUNTER — Ambulatory Visit: Payer: Medicare HMO | Admitting: Internal Medicine

## 2024-01-03 ENCOUNTER — Ambulatory Visit: Payer: Medicare HMO | Admitting: Internal Medicine

## 2024-01-08 ENCOUNTER — Other Ambulatory Visit: Payer: Self-pay | Admitting: Orthopedic Surgery

## 2024-01-08 ENCOUNTER — Encounter: Payer: Self-pay | Admitting: Orthopedic Surgery

## 2024-01-08 DIAGNOSIS — S42001A Fracture of unspecified part of right clavicle, initial encounter for closed fracture: Secondary | ICD-10-CM

## 2024-01-08 DIAGNOSIS — S42002A Fracture of unspecified part of left clavicle, initial encounter for closed fracture: Secondary | ICD-10-CM

## 2024-01-11 ENCOUNTER — Ambulatory Visit
Admission: RE | Admit: 2024-01-11 | Discharge: 2024-01-11 | Disposition: A | Discharge status: Home / Self Care | Payer: Self-pay | Source: Ambulatory Visit | Attending: Orthopedic Surgery | Admitting: Orthopedic Surgery

## 2024-01-11 DIAGNOSIS — S42002A Fracture of unspecified part of left clavicle, initial encounter for closed fracture: Secondary | ICD-10-CM

## 2024-02-20 ENCOUNTER — Encounter: Payer: Medicare HMO | Admitting: Internal Medicine

## 2024-02-20 ENCOUNTER — Encounter: Payer: Self-pay | Admitting: Internal Medicine

## 2024-02-20 NOTE — Progress Notes (Signed)
 Erroneous encounter - please disregard.

## 2024-04-29 ENCOUNTER — Telehealth: Payer: Self-pay

## 2024-04-29 NOTE — Telephone Encounter (Signed)
   Pre-operative Risk Assessment    Patient Name: Christine Gutierrez  DOB: September 28, 1951 MRN: [id number]   Date of last office visit: 11/15/23 Lasalle Pointer, NP Date of next office visit: 06/04/24 Kelly Patient, MD   Request for Surgical Clearance    Procedure:  ORIF LT CLAVICLE FOR NON UNION  Date of Surgery:  Clearance TBD                                Surgeon:  Randal Bury, MD Surgeon's Group or Practice Name:  [facility] Phone number:  [phone]   EXT 3134 Fax number:  [phone]  ATTN: KELLY HIGH   Type of Clearance Requested:   - Medical  - Pharmacy:  Hold Aspirin     Type of Anesthesia:  CHOICE   Additional requests/questions:    Kita Perish   04/29/2024, 12:08 PM

## 2024-04-29 NOTE — Telephone Encounter (Signed)
 S/W pt and scheduled IN OFFICE Preop appt 05/14/24 with Marlyse Single, PA

## 2024-04-29 NOTE — Telephone Encounter (Signed)
   Name: Ranika Mcniel  DOB: 01-29-1951  MRN: [id number]  Primary Cardiologist: Vishnu P Mallipeddi, MD  Chart reviewed as part of pre-operative protocol coverage. Because of Lakeisha Waldrop past medical history and time since last visit, she will require a follow-up in-office visit in order to better assess preoperative cardiovascular risk.  Patient had previous event monitor ordered however not completed to rule out possible AF.  Pre-op covering staff: - Please schedule appointment and call patient to inform them. If patient already had an upcoming appointment within acceptable timeframe, please add "pre-op clearance" to the appointment notes so provider is aware. - Please contact requesting surgeon's office via preferred method (i.e, phone, fax) to inform them of need for appointment prior to surgery.   Francene Ing, Retha Cast, NP  04/29/2024, 12:29 PM

## 2024-05-14 ENCOUNTER — Ambulatory Visit: Admitting: Physician Assistant

## 2024-05-14 NOTE — Progress Notes (Deleted)
   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 History of Present Illness Christine Gutierrez is a 73 y.o. female who returns for surgical clearance. She was last seen in 11/2023 by Christine Pointer, NP for posthospitalization follow-up.  The patient had stopped Eliquis  and declined to restart it.  An outpatient monitor was ordered to evaluate for recurrent atrial fibrillation.  This was never completed. She needs left clavicular ORIF with Dr. Abigail Gutierrez.  Type of anesthesia unknown.  Request is to hold aspirin.    ROS-See HPI***    Studies Reviewed:      *** Results  Risk Assessment/Calculations: {Does this patient have ATRIAL FIBRILLATION?:[phone]} No BP recorded.  {Refresh Note OR Click here to enter BP  :1}***   STOP-Bang Score:  4  { Consider Dx Sleep Disordered Breathing or Sleep Apnea  ICD G47.33          :1}   Physical Exam:  VS:  There were no vitals taken for this visit.   Wt Readings from Last 3 Encounters:  11/15/23 193 lb (87.5 kg)  10/22/23 202 lb 9.6 oz (91.9 kg)  07/14/22 194 lb (88 kg)    Physical Exam***     Assessment and Plan: Assessment & Plan Preoperative cardiovascular  examination  Paroxysmal A-fib (HCC)  Essential hypertension, benign  Assessment and Plan Assessment & Plan    {      :1}    {Are you ordering a CV Procedure (e.g. stress test, cath, DCCV, TEE, etc)?   Press F2        :[id number]}  Dispo:  No follow-ups on file. Signed, Christine Single, PA-C

## 2024-05-18 NOTE — Progress Notes (Deleted)
 Cardiology Office Note    Date:  05/18/2024  ID:  Christine Gutierrez, DOB [birth year], MRN [id number] Cardiologist: Vishnu P Mallipeddi, MD    History of Present Illness:    Christine Gutierrez is a 73 y.o. female with past medical history of paroxysmal atrial fibrillation (diagnosed during admission in 10/2023 in the setting of severe sepsis and converted back to NSR prior to discharge), HTN, HLD and hypothyroidism who presents to the office today for preoperative cardiac clearance.  She was last examined by Lasalle Pointer, NP in 11/2023 for hospital follow-up and reported some fatigue but denied any recent chest pain or palpitations. She had not yet started Eliquis  and refused to take at that time. A 14-day Zio patch was recommended to assess her atrial fibrillation burden and she was informed to follow-up in 6 to 8 weeks but has not been evaluated by Cardiology since. It does not appear she wore her monitor.  The office did receive a cardiac clearance request for ORIF of her left clavicle and an office visit was recommended given the timeframe since last evaluation.  - LDL from PCP  Studies Reviewed:   EKG: EKG is*** ordered today and demonstrates ***   EKG Interpretation Date/Time:    Ventricular Rate:    PR Interval:    QRS Duration:    QT Interval:    QTC Calculation:   R Axis:      Text Interpretation:         Echocardiogram: 10/2023 IMPRESSIONS     1. Left ventricular ejection fraction, by estimation, is 60 to 65%. The  left ventricle has normal function. The left ventricle has no regional  wall motion abnormalities. There is severe asymmetric left ventricular  hypertrophy of the septal segment. Left   ventricular diastolic function could not be evaluated.   2. Right ventricular systolic function is normal. The right ventricular  size is normal. There is normal pulmonary artery systolic pressure.   3. Left atrial size was mildly dilated.   4. Right atrial  size was mildly dilated.   5. The mitral valve is normal in structure. Trivial mitral valve  regurgitation. No evidence of mitral stenosis.   6. The aortic valve is tricuspid. Aortic valve regurgitation is not  visualized. No aortic stenosis is present.   7. The inferior vena cava is normal in size with greater than 50%  respiratory variability, suggesting right atrial pressure of 3 mmHg.   Comparison(s): No prior Echocardiogram.   Risk Assessment/Calculations:   {Does this patient have ATRIAL FIBRILLATION?:[phone]} No BP recorded.  {Refresh Note OR Click here to enter BP  :1}***   STOP-Bang Score:  4  { Consider Dx Sleep Disordered Breathing or Sleep Apnea  ICD G47.33          :1}      Physical Exam:   VS:  There were no vitals taken for this visit.   Wt Readings from Last 3 Encounters:  11/15/23 193 lb (87.5 kg)  10/22/23 202 lb 9.6 oz (91.9 kg)  07/14/22 194 lb (88 kg)     GEN: Well nourished, well developed in no acute distress NECK: No JVD; No carotid bruits CARDIAC: ***RRR, no murmurs, rubs, gallops RESPIRATORY:  Clear to auscultation without rales, wheezing or rhonchi  ABDOMEN: Appears non-distended. No obvious abdominal masses. EXTREMITIES: No clubbing or cyanosis. No edema.  Distal pedal pulses are 2+ bilaterally.   Assessment and Plan:   1. Paroxysmal A-fib (HCC) ***  2. Essential  hypertension, benign ***  3. Hyperlipidemia, unspecified hyperlipidemia type ***  4. Preoperative cardiovascular examination ***    Signed, Dorma Gash, PA-C

## 2024-05-20 ENCOUNTER — Ambulatory Visit: Attending: Student | Admitting: Student

## 2024-05-20 DIAGNOSIS — Z01810 Encounter for preprocedural cardiovascular examination: Secondary | ICD-10-CM

## 2024-05-20 DIAGNOSIS — I480 Paroxysmal atrial fibrillation: Secondary | ICD-10-CM

## 2024-05-20 DIAGNOSIS — E785 Hyperlipidemia, unspecified: Secondary | ICD-10-CM

## 2024-05-20 DIAGNOSIS — I10 Essential (primary) hypertension: Secondary | ICD-10-CM

## 2024-06-04 ENCOUNTER — Ambulatory Visit: Attending: Internal Medicine | Admitting: Internal Medicine

## 2024-06-04 ENCOUNTER — Encounter: Payer: Self-pay | Admitting: Internal Medicine

## 2024-06-04 VITALS — BP 120/72 | HR 70 | Ht 66.0 in | Wt 194.6 lb

## 2024-06-04 DIAGNOSIS — Z01810 Encounter for preprocedural cardiovascular examination: Secondary | ICD-10-CM | POA: Diagnosis not present

## 2024-06-04 DIAGNOSIS — I159 Secondary hypertension, unspecified: Secondary | ICD-10-CM | POA: Diagnosis not present

## 2024-06-04 DIAGNOSIS — I480 Paroxysmal atrial fibrillation: Secondary | ICD-10-CM

## 2024-06-04 DIAGNOSIS — I4891 Unspecified atrial fibrillation: Secondary | ICD-10-CM

## 2024-06-04 NOTE — Patient Instructions (Signed)

## 2024-06-04 NOTE — Progress Notes (Signed)
 Cardiology Office Note  Date: 06/04/2024   ID: Gutierrez, Christine Jan 23, 1951, MRN [id number]  PCP:  Henriette Anes, DO  Cardiologist:  Cyprian Gongaware P Antwian Santaana, MD Electrophysiologist:  None   History of Present Illness: Christine Gutierrez is a 73 y.o. female  Follow-up visit of paroxysmal A-fib.  Doing great, has no symptoms.  No angina, DOE, dizziness, syncope, leg swelling, palpitations or fatigue.  EKG today showed NSR.  She has OSA symptoms but never tested for OSA.  Not taking aspirin.  Not on systemic AC either.  Past Medical History:  Diagnosis Date   Hypertension     Past Surgical History:  Procedure Laterality Date   RECTO-VAGINAL FISSURE REPAIR      Current Outpatient Medications  Medication Sig Dispense Refill   levothyroxine  (SYNTHROID , LEVOTHROID) 25 MCG tablet Take 25 mcg by mouth daily before breakfast.     valsartan (DIOVAN) 160 MG tablet Take 160 mg by mouth daily.     No current facility-administered medications for this visit.   Allergies:  Nitrofurantoin, Sulfa antibiotics, and Statins   Social History: The patient  reports that she has never smoked. She has been exposed to tobacco smoke. She has never used smokeless tobacco. She reports that she does not drink alcohol and does not use drugs.   Family History: The patient's family history is not on file.   ROS:  Please see the history of present illness. Otherwise, complete review of systems is positive for none  All other systems are reviewed and negative.   Physical Exam: VS:  BP 120/72   Pulse 70   Ht 5' 6 (1.676 m)   Wt 194 lb 9.6 oz (88.3 kg)   SpO2 98%   BMI 31.41 kg/m , BMI Body mass index is 31.41 kg/m.  Wt Readings from Last 3 Encounters:  06/04/24 194 lb 9.6 oz (88.3 kg)  11/15/23 193 lb (87.5 kg)  10/22/23 202 lb 9.6 oz (91.9 kg)    General: Patient appears comfortable at rest. HEENT: Conjunctiva and lids normal, oropharynx clear with moist mucosa. Neck: Supple, no  elevated JVP or carotid bruits, no thyromegaly. Lungs: Clear to auscultation, nonlabored breathing at rest. Cardiac: Regular rate and rhythm, no S3 or significant systolic murmur, no pericardial rub. Abdomen: Soft, nontender, no hepatomegaly, bowel sounds present, no guarding or rebound. Extremities: No pitting edema, distal pulses 2+. Skin: Warm and dry. Musculoskeletal: No kyphosis. Neuropsychiatric: Alert and oriented x3, affect grossly appropriate.  Recent Labwork: 10/21/2023: B Natriuretic Peptide 108.0; TSH 6.254 10/23/2023: ALT 26; AST 40; BUN 44; Creatinine, Ser 1.39; Hemoglobin 10.4; Magnesium 2.0; Platelets 135; Potassium 4.1; Sodium 138  No results found for: CHOL, TRIG, HDL, CHOLHDL, VLDL, LDLCALC, LDLDIRECT    Assessment and Plan:  Paroxysmal A-fib: New onset in 10/2023 at [facility] when she was admitted with atypical pneumonia.  Spontaneously converted to NSR.  She was recommended event monitor but did not wear it.  EKG today showed NSR.  Not on any systemic AC.  She does have OSA symptoms, will recommend home sleep study.  If she has OSA, she will need to be on systemic AC due to high risk of recurrent A-fib.  Patient verbalized understanding.  Echocardiogram in 2020 for November showed normal LVEF, mild dilatation of the LA and no valvular heart disease.  Preop cardiac risk stratification for ORIF of nonunion of left clavicle: Low risk for any perioperative cardiac applications.  Currently not on any antiplatelets or anticoagulants.  HTN, controlled:  Continue valsartan 160 mg once daily, follow with PCP.       Medication Adjustments/Labs and Tests Ordered: Current medicines are reviewed at length with the patient today.  Concerns regarding medicines are outlined above.    Disposition:  Follow up 1 year  Signed Annastyn Silvey Priya Delno Blaisdell, MD, 06/04/2024 12:51 PM    [facility] at [facility] [address] [place], [place], [place] [address]

## 2024-07-15 ENCOUNTER — Telehealth: Payer: Self-pay | Admitting: *Deleted

## 2024-07-15 NOTE — Telephone Encounter (Signed)
 Ordering provider: DR STACIA Associated diagnoses: I48.91 WatchPAT PA obtained on 07/15/2024 by Joshua Dalton Seip, CMA. Authorization: NO PA REQ FOR HST  Patient notified of PIN (1234) on 07/15/2024 via Notification Method: phone.

## 2024-07-31 NOTE — Telephone Encounter (Signed)
 Called patient to verify that she has completed test. She stated that she was concerned whether it would pick up accurate information due to her pets. She stated that sometimes her dogs wake her up all times of the night by barking. So she wouldn't be sleeping that much during the night. Advised her will route to Dr. Mallipeddi for further advise.

## 2024-07-31 NOTE — Telephone Encounter (Signed)
 Per Dr. Mallipeddi:  Can order split night sleep study instead.   Called patient to let her know of Dr. Alissa recommendations. Patient stated that she didn't want to do it right now. Will call back later on to let us  know when she is ready to complete the test. No further issues. Will route to Sleep Study pool so they are aware as well. Advised patient that she will need to return device back to the office to avoid the $100 fee.
